# Patient Record
Sex: Female | Born: 1960 | Race: White | Hispanic: No | Marital: Single | Smoking: Never smoker
Health system: Southern US, Community
[De-identification: ages and names within clinical notes are randomized; demographics above are authoritative.]

## PROBLEM LIST (undated history)

## (undated) DIAGNOSIS — F32A Depression, unspecified: Secondary | ICD-10-CM

## (undated) DIAGNOSIS — N200 Calculus of kidney: Secondary | ICD-10-CM

## (undated) DIAGNOSIS — N390 Urinary tract infection, site not specified: Secondary | ICD-10-CM

## (undated) DIAGNOSIS — T8859XA Other complications of anesthesia, initial encounter: Secondary | ICD-10-CM

## (undated) DIAGNOSIS — L0390 Cellulitis, unspecified: Secondary | ICD-10-CM

## (undated) DIAGNOSIS — J189 Pneumonia, unspecified organism: Secondary | ICD-10-CM

## (undated) DIAGNOSIS — M1990 Unspecified osteoarthritis, unspecified site: Secondary | ICD-10-CM

## (undated) DIAGNOSIS — R51 Headache: Secondary | ICD-10-CM

## (undated) DIAGNOSIS — R768 Other specified abnormal immunological findings in serum: Secondary | ICD-10-CM

## (undated) DIAGNOSIS — L409 Psoriasis, unspecified: Secondary | ICD-10-CM

## (undated) DIAGNOSIS — T4145XA Adverse effect of unspecified anesthetic, initial encounter: Secondary | ICD-10-CM

## (undated) DIAGNOSIS — R252 Cramp and spasm: Secondary | ICD-10-CM

## (undated) DIAGNOSIS — R5383 Other fatigue: Secondary | ICD-10-CM

## (undated) DIAGNOSIS — R519 Headache, unspecified: Secondary | ICD-10-CM

## (undated) DIAGNOSIS — G4730 Sleep apnea, unspecified: Secondary | ICD-10-CM

## (undated) DIAGNOSIS — B9620 Unspecified Escherichia coli [E. coli] as the cause of diseases classified elsewhere: Secondary | ICD-10-CM

## (undated) DIAGNOSIS — F329 Major depressive disorder, single episode, unspecified: Secondary | ICD-10-CM

## (undated) HISTORY — PX: LIVER BIOPSY: SHX301

## (undated) HISTORY — PX: WISDOM TOOTH EXTRACTION: SHX21

---

## 1998-10-12 HISTORY — PX: DILATION AND CURETTAGE OF UTERUS: SHX78

## 2018-03-15 ENCOUNTER — Ambulatory Visit (HOSPITAL_COMMUNITY)
Admission: EM | Admit: 2018-03-15 | Discharge: 2018-03-15 | Disposition: A | Discharge status: Home / Self Care | Payer: BLUE CROSS/BLUE SHIELD | Attending: Emergency Medicine | Admitting: Emergency Medicine

## 2018-03-15 ENCOUNTER — Other Ambulatory Visit: Payer: Self-pay

## 2018-03-15 ENCOUNTER — Encounter (HOSPITAL_COMMUNITY): Payer: Self-pay | Admitting: Emergency Medicine

## 2018-03-15 DIAGNOSIS — L409 Psoriasis, unspecified: Secondary | ICD-10-CM | POA: Diagnosis not present

## 2018-03-15 DIAGNOSIS — N23 Unspecified renal colic: Secondary | ICD-10-CM

## 2018-03-15 DIAGNOSIS — R109 Unspecified abdominal pain: Secondary | ICD-10-CM

## 2018-03-15 DIAGNOSIS — R112 Nausea with vomiting, unspecified: Secondary | ICD-10-CM | POA: Diagnosis not present

## 2018-03-15 HISTORY — DX: Psoriasis, unspecified: L40.9

## 2018-03-15 LAB — POCT URINALYSIS DIP (DEVICE)
Bilirubin Urine: NEGATIVE
Glucose, UA: NEGATIVE mg/dL
Ketones, ur: NEGATIVE mg/dL
Nitrite: POSITIVE — AB
PROTEIN: NEGATIVE mg/dL
SPECIFIC GRAVITY, URINE: 1.02 (ref 1.005–1.030)
UROBILINOGEN UA: 0.2 mg/dL (ref 0.0–1.0)
pH: 8.5 — ABNORMAL HIGH (ref 5.0–8.0)

## 2018-03-15 MED ORDER — TRAMADOL HCL 50 MG PO TABS: 50.0000 mg | ORAL_TABLET | Freq: Four times a day (QID) | ORAL | 0 refills | Status: AC | PRN

## 2018-03-15 MED ORDER — KETOROLAC TROMETHAMINE 60 MG/2ML IM SOLN
60.0000 mg | Freq: Once | INTRAMUSCULAR | Status: AC
  Administered 2018-03-15: 60 mg via INTRAMUSCULAR

## 2018-03-15 MED ORDER — CEPHALEXIN 500 MG PO CAPS: 500.0000 mg | ORAL_CAPSULE | Freq: Three times a day (TID) | ORAL | 0 refills | Status: AC

## 2018-03-15 MED ORDER — NAPROXEN 500 MG PO TABS: 500.0000 mg | ORAL_TABLET | Freq: Two times a day (BID) | ORAL | 0 refills | Status: DC

## 2018-03-15 MED ORDER — KETOROLAC TROMETHAMINE 60 MG/2ML IM SOLN
INTRAMUSCULAR | Status: AC
  Filled 2018-03-15: qty 2

## 2018-03-15 MED ORDER — ONDANSETRON HCL 4 MG PO TABS: 4.0000 mg | ORAL_TABLET | Freq: Four times a day (QID) | ORAL | 0 refills | Status: DC

## 2018-03-15 NOTE — ED Provider Notes (Signed)
03/15/2018 8:05 PM   DOB: 03/04/1961 / MRN: [id number]  SUBJECTIVE:  Rebecca Maldonado is a 57 y.o. female presenting for left-sided flank pain, nausea, emesis.  Symptoms started today.  Patient is moving her bowels normally denies dysuria, urgency, frequency,, hematuria.  Pain comes in waves.  She is allergic to wellbutrin [bupropion].   She  has a past medical history of Psoriasis.    She  reports that she has never smoked. She has never used smokeless tobacco. She reports that she drinks alcohol. She reports that she does not use drugs. She  has no sexual activity history on file. The patient  has a past surgical history that includes Cesarean section.  Her family history is not on file.  Review of Systems  Constitutional: Negative for chills, diaphoresis and fever.  Eyes: Negative.   Respiratory: Negative for cough, hemoptysis, sputum production, shortness of breath and wheezing.   Cardiovascular: Negative for chest pain.  Gastrointestinal: Positive for nausea and vomiting. Negative for abdominal pain, blood in stool, constipation, diarrhea, heartburn and melena.  Genitourinary: Positive for flank pain.  Skin: Negative for rash.  Neurological: Negative for dizziness, sensory change, speech change, focal weakness and headaches.    OBJECTIVE:  BP 115/78 (BP Location: Left Arm)   Pulse 81   Temp 98.2 F (36.8 C) (Oral)   Resp 20   SpO2 95%   Wt Readings from Last 3 Encounters:  No data found for Wt   Temp Readings from Last 3 Encounters:  03/15/18 98.2 F (36.8 C) (Oral)   BP Readings from Last 3 Encounters:  03/15/18 115/78   Pulse Readings from Last 3 Encounters:  03/15/18 81    Physical Exam  Constitutional: She is oriented to person, place, and time. She appears well-nourished. No distress.  Eyes: Pupils are equal, round, and reactive to light. EOM are normal.  Cardiovascular: Normal rate, regular rhythm, S1 normal, S2 normal, normal heart sounds and intact distal  pulses. Exam reveals no gallop, no friction rub and no decreased pulses.  No murmur heard. Pulmonary/Chest: Effort normal. No stridor. No respiratory distress. She has no wheezes. She has no rales.  Abdominal: She exhibits no distension. There is tenderness. There is CVA tenderness (left). There is no rigidity, no rebound, no guarding, no tenderness at McBurney's point and negative Murphy's sign.  Musculoskeletal: She exhibits no edema.  Neurological: She is alert and oriented to person, place, and time. No cranial nerve deficit. Gait normal.  Skin: Skin is dry. She is not diaphoretic.  Psychiatric: She has a normal mood and affect.  Vitals reviewed.   Results for orders placed or performed during the hospital encounter of 03/15/18 (from the past 72 hour(s))  POCT urinalysis dip (device)     Status: Abnormal   Collection Time: 03/15/18  7:58 PM  Result Value Ref Range   Glucose, UA NEGATIVE NEGATIVE mg/dL   Bilirubin Urine NEGATIVE NEGATIVE   Ketones, ur NEGATIVE NEGATIVE mg/dL   Specific Gravity, Urine 1.020 1.005 - 1.030   Hgb urine dipstick TRACE (A) NEGATIVE   pH 8.5 (H) 5.0 - 8.0   Protein, ur NEGATIVE NEGATIVE mg/dL   Urobilinogen, UA 0.2 0.0 - 1.0 mg/dL   Nitrite POSITIVE (A) NEGATIVE   Leukocytes, UA TRACE (A) NEGATIVE    Comment: Biochemical Testing Only. Please order routine urinalysis from main lab if confirmatory testing is needed.    No results found.  ASSESSMENT AND PLAN:   Left flank pain: 57 year old female here today  with chief complaint of left flank pain with radicular pattern to the left groin.  Nitrites + but no signs of toxicity. She is afebrile here and appears to be in no acute distress.  She has some flank tenderness on the left. Will treat as a stone along with abx.  She will call urology if the symptoms continue given this is most likely a stone.  Renal colic on left side    Discharge Instructions     Take the medication as prescribed.  Okay to add  in tramadol for break through pain. I am referring you to urology however if the pain gets to bad you may have to go to the ED for fluids and pain meds.         The patient is advised to call or return to clinic if she does not see an improvement in symptoms, or to seek the care of the closest emergency department if she worsens with the above plan.   Deliah BostonMichael Meredith Kilbride, MHS, PA-C 03/15/2018 8:05 PM   Rebecca Maldonado, Rebecca Verrastro L, PA-C 03/15/18 2007

## 2018-03-15 NOTE — ED Triage Notes (Signed)
[facility] a complaint of left side pain that started in her back and radiates into her groin area. The patient reported that the pain started today at work.

## 2018-03-15 NOTE — Discharge Instructions (Addendum)
Urine might be infected. I will culture. Take the medication as prescribed.  Okay to add in tramadol for break through pain. I am referring you to urology however if the pain gets to bad you may have to go to the ED for fluids and pain meds.

## 2018-03-18 ENCOUNTER — Telehealth (HOSPITAL_COMMUNITY): Payer: Self-pay

## 2018-03-18 DIAGNOSIS — B9620 Unspecified Escherichia coli [E. coli] as the cause of diseases classified elsewhere: Secondary | ICD-10-CM

## 2018-03-18 HISTORY — DX: Unspecified Escherichia coli (E. coli) as the cause of diseases classified elsewhere: B96.20

## 2018-03-18 LAB — URINE CULTURE: Culture: >=100000 — AB

## 2018-03-18 NOTE — Telephone Encounter (Signed)
Urine culture positive for E.Coli, this was treated with Keflex at urgent care visit. Attempted to reach patient to go over results. No answer at this time.

## 2018-08-30 ENCOUNTER — Other Ambulatory Visit: Payer: Self-pay | Admitting: Urology

## 2018-08-31 ENCOUNTER — Other Ambulatory Visit (HOSPITAL_COMMUNITY): Payer: Self-pay | Admitting: Urology

## 2018-08-31 DIAGNOSIS — N200 Calculus of kidney: Secondary | ICD-10-CM

## 2018-09-05 ENCOUNTER — Encounter (HOSPITAL_COMMUNITY): Payer: Self-pay

## 2018-09-05 NOTE — Patient Instructions (Signed)
Your procedure is scheduled on: Monday, Dec. 2, 2019   Surgery Time:  11:00AM-1:00PM   Report to [facility] Main  Entrance    Report to admitting at 8:00 AM   Call this number if you have problems the morning of surgery [phone]   Do not eat food or drink liquids :After Midnight.   Brush your teeth the morning of surgery.   Do NOT smoke after Midnight   Take these medicines the morning of surgery with A SIP OF WATER: None                               You may not have any metal on your body including hair pins, jewelry, and body piercings             Do not wear make-up, lotions, powders, perfumes/cologne, or deodorant             Do not wear nail polish.  Do not shave  48 hours prior to surgery.                Do not bring valuables to the hospital. [facility] IS NOT             RESPONSIBLE   FOR VALUABLES.   Contacts, dentures or bridgework may not be worn into surgery.   Leave suitcase in the car. After surgery it may be brought to your room.    Special Instructions: Bring a copy of your healthcare power of attorney and living will documents         the day of surgery if you haven't scanned them in before.              Please read over the following fact sheets you were given:  [facility] Pre[facility] Before surgery, you can play an important role.  Because skin is not sterile, your skin needs to be as free of germs as possible.  You can reduce the number of germs on your skin by washing with CHG (chlorahexidine gluconate) soap before surgery.  CHG is an antiseptic cleaner which kills germs and bonds with the skin to continue killing germs even after washing. Please DO NOT use if you have an allergy to CHG or antibacterial soaps.  If your skin becomes reddened/irritated stop using the CHG and inform your nurse when you arrive at Short Stay. Do not shave (including legs and underarms) for at least 48 hours prior to the first CHG shower.  You may  shave your face/neck.  Please follow these instructions carefully:  1.  Shower with CHG Soap the night before surgery and the  morning of surgery.  2.  If you choose to wash your hair, wash your hair first as usual with your normal  shampoo.  3.  After you shampoo, rinse your hair and body thoroughly to remove the shampoo.                             4.  Use CHG as you would any other liquid soap.  You can apply chg directly to the skin and wash.  Gently with a scrungie or clean washcloth.  5.  Apply the CHG Soap to your body ONLY FROM THE NECK DOWN.   Do   not use on face/ open  Wound or open sores. Avoid contact with eyes, ears mouth and   genitals (private parts).                       Wash face,  Genitals (private parts) with your normal soap.             6.  Wash thoroughly, paying special attention to the area where your    surgery  will be performed.  7.  Thoroughly rinse your body with warm water from the neck down.  8.  DO NOT shower/wash with your normal soap after using and rinsing off the CHG Soap.                9.  Pat yourself dry with a clean towel.            10.  Wear clean pajamas.            11.  Place clean sheets on your bed the night of your first shower and do not  sleep with pets. Day of Surgery : Do not apply any lotions/deodorants the morning of surgery.  Please wear clean clothes to the hospital/surgery center.  FAILURE TO FOLLOW THESE INSTRUCTIONS MAY RESULT IN THE CANCELLATION OF YOUR SURGERY  PATIENT SIGNATURE_________________________________  NURSE SIGNATURE__________________________________  ________________________________________________________________________

## 2018-09-06 ENCOUNTER — Other Ambulatory Visit: Payer: Self-pay

## 2018-09-06 ENCOUNTER — Encounter (HOSPITAL_COMMUNITY): Payer: Self-pay

## 2018-09-06 ENCOUNTER — Encounter (HOSPITAL_COMMUNITY)
Admission: RE | Admit: 2018-09-06 | Discharge: 2018-09-06 | Disposition: A | Discharge status: Home / Self Care | Payer: BLUE CROSS/BLUE SHIELD | Source: Ambulatory Visit | Attending: Urology | Admitting: Urology

## 2018-09-06 DIAGNOSIS — N200 Calculus of kidney: Secondary | ICD-10-CM | POA: Diagnosis not present

## 2018-09-06 DIAGNOSIS — Z01812 Encounter for preprocedural laboratory examination: Secondary | ICD-10-CM | POA: Diagnosis not present

## 2018-09-06 HISTORY — DX: Major depressive disorder, single episode, unspecified: F32.9

## 2018-09-06 HISTORY — DX: Headache, unspecified: R51.9

## 2018-09-06 HISTORY — DX: Adverse effect of unspecified anesthetic, initial encounter: T41.45XA

## 2018-09-06 HISTORY — DX: Other fatigue: R53.83

## 2018-09-06 HISTORY — DX: Other complications of anesthesia, initial encounter: T88.59XA

## 2018-09-06 HISTORY — DX: Unspecified osteoarthritis, unspecified site: M19.90

## 2018-09-06 HISTORY — DX: Calculus of kidney: N20.0

## 2018-09-06 HISTORY — DX: Urinary tract infection, site not specified: N39.0

## 2018-09-06 HISTORY — DX: Sleep apnea, unspecified: G47.30

## 2018-09-06 HISTORY — DX: Pneumonia, unspecified organism: J18.9

## 2018-09-06 HISTORY — DX: Depression, unspecified: F32.A

## 2018-09-06 HISTORY — DX: Other specified abnormal immunological findings in serum: R76.8

## 2018-09-06 HISTORY — DX: Headache: R51

## 2018-09-06 HISTORY — DX: Morbid (severe) obesity due to excess calories: E66.01

## 2018-09-06 HISTORY — DX: Unspecified Escherichia coli (E. coli) as the cause of diseases classified elsewhere: B96.20

## 2018-09-06 LAB — CBC
HCT: 41.2 % (ref 36.0–46.0)
HEMOGLOBIN: 13.2 g/dL (ref 12.0–15.0)
MCH: 29.1 pg (ref 26.0–34.0)
MCHC: 32 g/dL (ref 30.0–36.0)
MCV: 90.9 fL (ref 80.0–100.0)
NRBC: 0 % (ref 0.0–0.2)
PLATELETS: 311 10*3/uL (ref 150–400)
RBC: 4.53 MIL/uL (ref 3.87–5.11)
RDW: 13.2 % (ref 11.5–15.5)
WBC: 5.6 10*3/uL (ref 4.0–10.5)

## 2018-09-09 ENCOUNTER — Other Ambulatory Visit: Payer: Self-pay | Admitting: Radiology

## 2018-09-11 MED ORDER — DEXTROSE 5 % IV SOLN
3.0000 g | INTRAVENOUS | Status: AC
  Administered 2018-09-12: 3 g via INTRAVENOUS
  Filled 2018-09-11: qty 3

## 2018-09-11 NOTE — Discharge Instructions (Signed)

## 2018-09-12 ENCOUNTER — Other Ambulatory Visit: Payer: Self-pay

## 2018-09-12 ENCOUNTER — Ambulatory Visit (HOSPITAL_COMMUNITY)
Admission: RE | Admit: 2018-09-12 | Discharge: 2018-09-13 | Disposition: A | Discharge status: Home / Self Care | Payer: BLUE CROSS/BLUE SHIELD | Source: Ambulatory Visit | Attending: Urology | Admitting: Urology

## 2018-09-12 ENCOUNTER — Ambulatory Visit (HOSPITAL_COMMUNITY)
Admission: RE | Admit: 2018-09-12 | Discharge: 2018-09-12 | Disposition: A | Discharge status: Home / Self Care | Payer: BLUE CROSS/BLUE SHIELD | Source: Ambulatory Visit | Attending: Urology | Admitting: Urology

## 2018-09-12 ENCOUNTER — Ambulatory Visit (HOSPITAL_COMMUNITY): Payer: BLUE CROSS/BLUE SHIELD

## 2018-09-12 ENCOUNTER — Encounter (HOSPITAL_COMMUNITY): Payer: Self-pay | Admitting: *Deleted

## 2018-09-12 ENCOUNTER — Ambulatory Visit (HOSPITAL_COMMUNITY): Payer: BLUE CROSS/BLUE SHIELD | Admitting: Anesthesiology

## 2018-09-12 ENCOUNTER — Encounter (HOSPITAL_COMMUNITY)
Admission: RE | Disposition: A | Discharge status: Home / Self Care | Payer: Self-pay | Source: Ambulatory Visit | Attending: Urology

## 2018-09-12 DIAGNOSIS — Z8379 Family history of other diseases of the digestive system: Secondary | ICD-10-CM | POA: Insufficient documentation

## 2018-09-12 DIAGNOSIS — N132 Hydronephrosis with renal and ureteral calculous obstruction: Secondary | ICD-10-CM | POA: Diagnosis not present

## 2018-09-12 DIAGNOSIS — Z87442 Personal history of urinary calculi: Secondary | ICD-10-CM | POA: Diagnosis not present

## 2018-09-12 DIAGNOSIS — Z87440 Personal history of urinary (tract) infections: Secondary | ICD-10-CM | POA: Insufficient documentation

## 2018-09-12 DIAGNOSIS — M1990 Unspecified osteoarthritis, unspecified site: Secondary | ICD-10-CM | POA: Insufficient documentation

## 2018-09-12 DIAGNOSIS — N3020 Other chronic cystitis without hematuria: Secondary | ICD-10-CM | POA: Diagnosis not present

## 2018-09-12 DIAGNOSIS — N200 Calculus of kidney: Secondary | ICD-10-CM

## 2018-09-12 DIAGNOSIS — Z6841 Body Mass Index (BMI) 40.0 and over, adult: Secondary | ICD-10-CM | POA: Insufficient documentation

## 2018-09-12 DIAGNOSIS — Z7982 Long term (current) use of aspirin: Secondary | ICD-10-CM | POA: Diagnosis not present

## 2018-09-12 DIAGNOSIS — Z825 Family history of asthma and other chronic lower respiratory diseases: Secondary | ICD-10-CM | POA: Insufficient documentation

## 2018-09-12 DIAGNOSIS — L409 Psoriasis, unspecified: Secondary | ICD-10-CM | POA: Insufficient documentation

## 2018-09-12 DIAGNOSIS — Z840 Family history of diseases of the skin and subcutaneous tissue: Secondary | ICD-10-CM | POA: Diagnosis not present

## 2018-09-12 DIAGNOSIS — F329 Major depressive disorder, single episode, unspecified: Secondary | ICD-10-CM | POA: Diagnosis not present

## 2018-09-12 DIAGNOSIS — G4730 Sleep apnea, unspecified: Secondary | ICD-10-CM | POA: Insufficient documentation

## 2018-09-12 HISTORY — PX: IR URETERAL STENT LEFT NEW ACCESS W/O SEP NEPHROSTOMY CATH: IMG6075

## 2018-09-12 HISTORY — PX: NEPHROLITHOTOMY: SHX5134

## 2018-09-12 LAB — CBC WITH DIFFERENTIAL/PLATELET
Abs Immature Granulocytes: 0.02 10*3/uL (ref 0.00–0.07)
Basophils Absolute: 0.1 10*3/uL (ref 0.0–0.1)
Basophils Relative: 1 %
Eosinophils Absolute: 0.2 10*3/uL (ref 0.0–0.5)
Eosinophils Relative: 5 %
HCT: 39.2 % (ref 36.0–46.0)
Hemoglobin: 12.7 g/dL (ref 12.0–15.0)
Immature Granulocytes: 0 %
Lymphocytes Relative: 33 %
Lymphs Abs: 1.6 10*3/uL (ref 0.7–4.0)
MCH: 30.3 pg (ref 26.0–34.0)
MCHC: 32.4 g/dL (ref 30.0–36.0)
MCV: 93.6 fL (ref 80.0–100.0)
MONOS PCT: 12 %
Monocytes Absolute: 0.6 10*3/uL (ref 0.1–1.0)
Neutro Abs: 2.4 10*3/uL (ref 1.7–7.7)
Neutrophils Relative %: 49 %
Platelets: 267 10*3/uL (ref 150–400)
RBC: 4.19 MIL/uL (ref 3.87–5.11)
RDW: 13.5 % (ref 11.5–15.5)
WBC: 4.9 10*3/uL (ref 4.0–10.5)
nRBC: 0 % (ref 0.0–0.2)

## 2018-09-12 LAB — BASIC METABOLIC PANEL
Anion gap: 10 (ref 5–15)
BUN: 18 mg/dL (ref 6–20)
CO2: 26 mmol/L (ref 22–32)
CREATININE: 0.84 mg/dL (ref 0.44–1.00)
Calcium: 9.1 mg/dL (ref 8.9–10.3)
Chloride: 104 mmol/L (ref 98–111)
GFR calc Af Amer: >60 mL/min (ref >60)
GFR calc non Af Amer: >60 mL/min (ref >60)
Glucose, Bld: 110 mg/dL — ABNORMAL HIGH (ref 70–99)
Potassium: 4.2 mmol/L (ref 3.5–5.1)
Sodium: 140 mmol/L (ref 135–145)

## 2018-09-12 LAB — PROTIME-INR
INR: 0.88
Prothrombin Time: 11.8 seconds (ref 11.4–15.2)

## 2018-09-12 SURGERY — NEPHROLITHOTOMY PERCUTANEOUS
Anesthesia: General | Site: Abdomen | Laterality: Left

## 2018-09-12 MED ORDER — PROPOFOL 10 MG/ML IV BOLUS
INTRAVENOUS | Status: AC
  Filled 2018-09-12: qty 20

## 2018-09-12 MED ORDER — FENTANYL CITRATE (PF) 100 MCG/2ML IJ SOLN
INTRAMUSCULAR | Status: AC | PRN
  Administered 2018-09-12 (×3): 50 ug via INTRAVENOUS

## 2018-09-12 MED ORDER — LIDOCAINE HCL 1 % IJ SOLN
INTRAMUSCULAR | Status: AC
  Filled 2018-09-12: qty 20

## 2018-09-12 MED ORDER — ACETAMINOPHEN 325 MG PO TABS: 650.0000 mg | ORAL_TABLET | ORAL | Status: DC | PRN

## 2018-09-12 MED ORDER — ROCURONIUM BROMIDE 10 MG/ML (PF) SYRINGE
PREFILLED_SYRINGE | INTRAVENOUS | Status: DC | PRN
  Administered 2018-09-12: 50 mg via INTRAVENOUS

## 2018-09-12 MED ORDER — CEFAZOLIN SODIUM-DEXTROSE 2-4 GM/100ML-% IV SOLN: 2.0000 g | INTRAVENOUS | Status: DC

## 2018-09-12 MED ORDER — OXYCODONE-ACETAMINOPHEN 5-325 MG PO TABS: 1.0000 | ORAL_TABLET | ORAL | Status: DC | PRN

## 2018-09-12 MED ORDER — HYDROMORPHONE HCL 1 MG/ML IJ SOLN
0.5000 mg | INTRAMUSCULAR | Status: DC | PRN
  Administered 2018-09-12 (×2): 1 mg via INTRAVENOUS
  Filled 2018-09-12 (×2): qty 1

## 2018-09-12 MED ORDER — LACTATED RINGERS IV SOLN
INTRAVENOUS | Status: DC
  Administered 2018-09-12: 08:00:00 via INTRAVENOUS

## 2018-09-12 MED ORDER — FENTANYL CITRATE (PF) 250 MCG/5ML IJ SOLN
INTRAMUSCULAR | Status: AC
  Filled 2018-09-12: qty 5

## 2018-09-12 MED ORDER — IOPAMIDOL (ISOVUE-300) INJECTION 61%
INTRAVENOUS | Status: AC
  Filled 2018-09-12: qty 50

## 2018-09-12 MED ORDER — BELLADONNA ALKALOIDS-OPIUM 16.2-60 MG RE SUPP: 1.0000 | Freq: Four times a day (QID) | RECTAL | Status: DC | PRN

## 2018-09-12 MED ORDER — HYDROCODONE-ACETAMINOPHEN 10-325 MG PO TABS: 1.0000 | ORAL_TABLET | ORAL | 0 refills | Status: AC | PRN

## 2018-09-12 MED ORDER — FENTANYL CITRATE (PF) 100 MCG/2ML IJ SOLN
INTRAMUSCULAR | Status: AC
  Filled 2018-09-12: qty 2

## 2018-09-12 MED ORDER — SODIUM CHLORIDE 0.9 % IV SOLN
INTRAVENOUS | Status: DC
  Administered 2018-09-12 – 2018-09-13 (×2): via INTRAVENOUS

## 2018-09-12 MED ORDER — MIDAZOLAM HCL 2 MG/2ML IJ SOLN
INTRAMUSCULAR | Status: AC
  Filled 2018-09-12: qty 2

## 2018-09-12 MED ORDER — IOHEXOL 300 MG/ML  SOLN
INTRAMUSCULAR | Status: DC | PRN
  Administered 2018-09-12: 30 mL

## 2018-09-12 MED ORDER — ONDANSETRON HCL 4 MG/2ML IJ SOLN
INTRAMUSCULAR | Status: DC | PRN
  Administered 2018-09-12: 4 mg via INTRAVENOUS

## 2018-09-12 MED ORDER — ONDANSETRON HCL 4 MG/2ML IJ SOLN: 4.0000 mg | INTRAMUSCULAR | Status: DC | PRN

## 2018-09-12 MED ORDER — SODIUM CHLORIDE 0.9 % IR SOLN
Status: DC | PRN
  Administered 2018-09-12: 1000 mL

## 2018-09-12 MED ORDER — DEXAMETHASONE SODIUM PHOSPHATE 10 MG/ML IJ SOLN
INTRAMUSCULAR | Status: DC | PRN
  Administered 2018-09-12: 10 mg via INTRAVENOUS

## 2018-09-12 MED ORDER — IOPAMIDOL (ISOVUE-300) INJECTION 61%
50.0000 mL | Freq: Once | INTRAVENOUS | Status: AC | PRN
  Administered 2018-09-12: 10 mL

## 2018-09-12 MED ORDER — SODIUM CHLORIDE 0.9 % IR SOLN
Status: DC | PRN
  Administered 2018-09-12: 15000 mL

## 2018-09-12 MED ORDER — LIDOCAINE 2% (20 MG/ML) 5 ML SYRINGE
INTRAMUSCULAR | Status: DC | PRN
  Administered 2018-09-12: 50 mg via INTRAVENOUS

## 2018-09-12 MED ORDER — FENTANYL CITRATE (PF) 100 MCG/2ML IJ SOLN
INTRAMUSCULAR | Status: DC | PRN
  Administered 2018-09-12 (×2): 50 ug via INTRAVENOUS
  Administered 2018-09-12: 100 ug via INTRAVENOUS

## 2018-09-12 MED ORDER — DIPHENHYDRAMINE HCL 50 MG/ML IJ SOLN
INTRAMUSCULAR | Status: DC | PRN
  Administered 2018-09-12: 12.5 mg via INTRAVENOUS

## 2018-09-12 MED ORDER — MIDAZOLAM HCL 2 MG/2ML IJ SOLN
INTRAMUSCULAR | Status: AC | PRN
  Administered 2018-09-12 (×4): 1 mg via INTRAVENOUS

## 2018-09-12 MED ORDER — SUGAMMADEX SODIUM 500 MG/5ML IV SOLN
INTRAVENOUS | Status: DC | PRN
  Administered 2018-09-12: 300 mg via INTRAVENOUS

## 2018-09-12 MED ORDER — SODIUM CHLORIDE 0.9 % IV SOLN: INTRAVENOUS | Status: DC

## 2018-09-12 MED ORDER — EPHEDRINE SULFATE-NACL 50-0.9 MG/10ML-% IV SOSY
PREFILLED_SYRINGE | INTRAVENOUS | Status: DC | PRN
  Administered 2018-09-12: 5 mg via INTRAVENOUS
  Administered 2018-09-12: 10 mg via INTRAVENOUS

## 2018-09-12 MED ORDER — CEFAZOLIN SODIUM-DEXTROSE 1-4 GM/50ML-% IV SOLN
1.0000 g | Freq: Three times a day (TID) | INTRAVENOUS | Status: AC
  Administered 2018-09-12 – 2018-09-13 (×2): 1 g via INTRAVENOUS
  Filled 2018-09-12 (×3): qty 50

## 2018-09-12 MED ORDER — PROPOFOL 10 MG/ML IV BOLUS
INTRAVENOUS | Status: DC | PRN
  Administered 2018-09-12: 200 mg via INTRAVENOUS

## 2018-09-12 MED ORDER — IOPAMIDOL (ISOVUE-300) INJECTION 61%
INTRAVENOUS | Status: AC
  Administered 2018-09-12: 10 mL
  Filled 2018-09-12: qty 50

## 2018-09-12 SURGICAL SUPPLY — 47 items
APPLICATOR SURGIFLO ENDO (HEMOSTASIS) ×2 IMPLANT
BAG URINE DRAINAGE (UROLOGICAL SUPPLIES) IMPLANT
BASKET ZERO TIP NITINOL 2.4FR (BASKET) IMPLANT
BENZOIN TINCTURE PRP APPL 2/3 (GAUZE/BANDAGES/DRESSINGS) ×2 IMPLANT
BLADE SURG 15 STRL LF DISP TIS (BLADE) ×1 IMPLANT
BLADE SURG 15 STRL SS (BLADE) ×1
CATH FOLEY 2W COUNCIL 20FR 5CC (CATHETERS) IMPLANT
CATH FOLEY 2WAY SLVR  5CC 18FR (CATHETERS) ×1
CATH FOLEY 2WAY SLVR 5CC 18FR (CATHETERS) ×1 IMPLANT
CATH IMAGER II 65CM (CATHETERS) ×2 IMPLANT
CATH X-FORCE N30 NEPHROSTOMY (TUBING) ×2 IMPLANT
CHLORAPREP W/TINT 26ML (MISCELLANEOUS) ×2 IMPLANT
COVER SURGICAL LIGHT HANDLE (MISCELLANEOUS) ×2 IMPLANT
COVER WAND RF STERILE (DRAPES) IMPLANT
DRAPE C-ARM 42X120 X-RAY (DRAPES) ×2 IMPLANT
DRAPE LINGEMAN PERC (DRAPES) ×2 IMPLANT
DRAPE SURG IRRIG POUCH 19X23 (DRAPES) ×2 IMPLANT
DRSG PAD ABDOMINAL 8X10 ST (GAUZE/BANDAGES/DRESSINGS) ×4 IMPLANT
DRSG TEGADERM 4X4.75 (GAUZE/BANDAGES/DRESSINGS) ×2 IMPLANT
DRSG TEGADERM 8X12 (GAUZE/BANDAGES/DRESSINGS) IMPLANT
FIBER LASER TRAC TIP (UROLOGICAL SUPPLIES) IMPLANT
GAUZE SPONGE 4X4 12PLY STRL (GAUZE/BANDAGES/DRESSINGS) ×2 IMPLANT
GLOVE BIOGEL M 8.0 STRL (GLOVE) ×2 IMPLANT
GOWN STRL REUS W/TWL XL LVL3 (GOWN DISPOSABLE) ×2 IMPLANT
GUIDEWIRE AMPLAZ .035X145 (WIRE) ×4 IMPLANT
GUIDEWIRE STR DUAL SENSOR (WIRE) ×2 IMPLANT
HOLDER FOLEY CATH W/STRAP (MISCELLANEOUS) ×2 IMPLANT
KIT BASIN OR (CUSTOM PROCEDURE TRAY) ×2 IMPLANT
KIT PROBE TRILOGY 3.9X350 (MISCELLANEOUS) ×2 IMPLANT
MANIFOLD NEPTUNE II (INSTRUMENTS) ×2 IMPLANT
NS IRRIG 1000ML POUR BTL (IV SOLUTION) ×2 IMPLANT
PACK CYSTO (CUSTOM PROCEDURE TRAY) ×2 IMPLANT
PROBE LITHOCLAST ULTRA 3.8X403 (UROLOGICAL SUPPLIES) IMPLANT
PROBE PNEUMATIC 1.0MMX570MM (UROLOGICAL SUPPLIES) IMPLANT
SHEATH PEELAWAY SET 9 (SHEATH) ×2 IMPLANT
SPONGE LAP 4X18 RFD (DISPOSABLE) ×2 IMPLANT
STENT URET 6FRX24 CONTOUR (STENTS) ×2 IMPLANT
SURGIFLO W/THROMBIN 8M KIT (HEMOSTASIS) ×2 IMPLANT
SUT MNCRL AB 4-0 PS2 18 (SUTURE) IMPLANT
SUT SILK 2 0 30  PSL (SUTURE)
SUT SILK 2 0 30 PSL (SUTURE) IMPLANT
SYR 10ML LL (SYRINGE) ×2 IMPLANT
SYR 20CC LL (SYRINGE) ×4 IMPLANT
TOWEL OR 17X26 10 PK STRL BLUE (TOWEL DISPOSABLE) ×2 IMPLANT
TOWEL OR NON WOVEN STRL DISP B (DISPOSABLE) ×2 IMPLANT
TRAY FOLEY MTR SLVR 16FR STAT (SET/KITS/TRAYS/PACK) ×2 IMPLANT
TUBING CONNECTING 10 (TUBING) ×4 IMPLANT

## 2018-09-12 NOTE — Progress Notes (Signed)
Night of surgery note  Subjective: The patient is doing well.  No complaints.  Objective: Vital signs in last 24 hours: Temp:  [97.8 F (36.6 C)-99.3 F (37.4 C)] 98.1 F (36.7 C) (12/02 1425) Pulse Rate:  [53-78] 61 (12/02 1425) Resp:  [10-19] 16 (12/02 1425) BP: (114-185)/(56-115) 143/66 (12/02 1425) SpO2:  [95 %-100 %] 100 % (12/02 1425) Weight:  [161[127 kg] 127 kg (12/02 1035)  Intake/Output this shift: Total I/O In: 1020 [P.O.:120; I.V.:900] Out: 425 [Urine:400; Blood:25]  Physical Exam:  General: Alert and oriented. Abdomen: Soft, Nondistended. Incision: Clean with dry, intact dressing. GU: Foley catheter draining pink urine with no clots.  Lab Results: Recent Labs    09/12/18 0802  HGB 12.7  HCT 39.2    Assessment/Plan: Discussed procedure and possibility of remaining fragments.  CT scan will be obtained to evaluate for this. 1) Continue to monitor 2) Per orders   Laquida Cotrell C. Vernie Ammonsttelin, MD  [place] FarmTTELIN,Steed Kanaan C 09/12/2018, 4:47 PM

## 2018-09-12 NOTE — H&P (Signed)
HPI: Rebecca Maldonado is a 57 year-old female who presents today for PCNL of her left renal calculi.  She does have a history of urinary infections. She has had 5 UTIs in the past year. Her recurrent UTIs have been occurring for 5 months. 0 infections required hospitalization. The following are the medication(s) that have been tried: cipro.   She has not had a kidney stone. She feels that she does empty her bladder. She has gone through menopause. She has not been on hormone replacement therapy.   08/02/18: The patient is self-referred for "recurrent UTIs". She was experiencing left flank pain that was felt to be colicky in nature and was seen in the ER at that time. She was found have nitrite positive urine and was diagnosed with a possible kidney stone however urine culture was performed and found to be positive for E. coli. There is no other documentation of urinary tract infections in Epic.  She 1st developed a UTI in 5/19 that was associated with pain in her lower back and left lower quadrant as well as nausea and vomiting. She was diagnosed with an infection, treated with antibiotics and noted complete resolution of her symptoms only to have her symptoms recurred again 2 weeks later again with associated nausea and vomiting but she was told she did not have any hematuria to suggest a possible stone and has no prior history of stones. She then had 3rd infection in 8/19 that was associated with mainly dysuria, fatigue and headache. She has had a 4th and now is experiencing symptoms of her 5th infection. Azo Standard helps with the symptoms. She believes this may be associated with dehydration but it is not associated with sexual intercourse. She does feel as if she empties her bladder completely. She has gone through menopause and is not on hormone replacement therapy.   08/18/18: She has returned today for further evaluation of the cause of her recurrent UTIs with renal ultrasound and cystoscopy. She  reports to me that she was doing well on antibiotic therapy but after completing her antibiotics she began to note symptoms to suggest her infection was possibly recurring. She also said that today she has noted some slight discomfort in her left flank region.     ALLERGIES: buPROPion - Hives Wellbutrin    MEDICATIONS: Aleve  Azo  Cystex Plus  Taltz Autoinjector     GU PSH: None   NON-GU PSH: C-Section D&&C    GU PMH: Chronic cystitis (w/o hematuria), She appears to be having recurrent UTIs. I am going to evaluate her upper tract with an ultrasound when she returns and then perform cystoscopy. We will then discuss options for management of her recurrent infections. - 08/02/2018    NON-GU PMH: Pyuria/other UA findings, She currently has bacteriuria and pyuria with nitrite positivity. I will go ahead and start her on empiric antibiotics and culture her urine. - 08/02/2018    FAMILY HISTORY: 1 son - Son 3 daughters - Daughter Asthma - Father COPD (chronic obstructive pulmonary disease) with - Father gallstones - Mother Hepatitis - Father, Mother psoriasis - Father   SOCIAL HISTORY: Marital Status: Divorced Preferred Language: English; Ethnicity: Not Hispanic Or Latino; Race: White Current Smoking Status: Patient has never smoked.   Tobacco Use Assessment Completed: Used Tobacco in last 30 days? Social Drinker.  Drinks 2 caffeinated drinks per day.    REVIEW OF SYSTEMS:    GU Review Female:   Patient denies frequent urination, hard to postpone urination,  burning /pain with urination, get up at night to urinate, leakage of urine, stream starts and stops, trouble starting your stream, have to strain to urinate, and being pregnant.  Gastrointestinal (Upper):   Patient denies nausea, vomiting, and indigestion/ heartburn.  Gastrointestinal (Lower):   Patient denies diarrhea and constipation.  Constitutional:   Patient denies night sweats, fever, fatigue, and weight loss.  Skin:    Patient denies skin rash/ lesion and itching.  Eyes:   Patient denies blurred vision and double vision.  Ears/ Nose/ Throat:   Patient denies sore throat and sinus problems.  Hematologic/Lymphatic:   Patient denies swollen glands and easy bruising.  Cardiovascular:   Patient denies leg swelling and chest pains.  Respiratory:   Patient denies cough and shortness of breath.  Endocrine:   Patient denies excessive thirst.  Musculoskeletal:   Patient denies back pain and joint pain.  Neurological:   Patient denies headaches and dizziness.  Psychologic:   Patient denies depression and anxiety.   VITAL SIGNS:   Weight 281 lb / 127.46 kg  Height 65 in / 165.1 cm  BP 136/66 mmHg  Pulse 68 /min  BMI 46.8 kg/m   GU PHYSICAL EXAMINATION:    External Genitalia: No hirsutism, no rash, no scarring, no cyst, no erythematous lesion, no papular lesion, no blanched lesion, no warty lesion. No edema.  Urethral Meatus: Normal size. Normal position. No discharge.  Urethra: No tenderness, no mass, no scarring. No hypermobility. No leakage.  Bladder: Normal to palpation, no tenderness, no mass, normal size.   MULTI-SYSTEM PHYSICAL EXAMINATION:    Constitutional: Well-nourished. No physical deformities. Normally developed. Good grooming.  Neck: Neck symmetrical, not swollen. Normal tracheal position.  Respiratory: No labored breathing, no use of accessory muscles.   Cardiovascular: Normal temperature, normal extremity pulses, no swelling, no varicosities.  Lymphatic: No enlargement of neck, axillae, groin.  Skin: No paleness, no jaundice, no cyanosis. No lesion, no ulcer, no rash.  Neurologic / Psychiatric: Oriented to time, oriented to place, oriented to person. No depression, no anxiety, no agitation.  Gastrointestinal: Obese abdomen. No mass, no tenderness, no rigidity.   Eyes: Normal conjunctivae. Normal eyelids.  Ears, Nose, Mouth, and Throat: Left ear no scars, no lesions, no masses. Right ear no  scars, no lesions, no masses. Nose no scars, no lesions, no masses. Normal hearing. Normal lips.  Musculoskeletal: Normal gait and station of head and neck.        PAST DATA REVIEWED:  Source Of History:  Patient  Records Review:   Previous Patient Records  Urine Test Review:   Urine Culture  Notes:                     Her urine culture at the time of her last visit was positive for E coli.   PROCEDURES:         Flexible Cystoscopy - done on 08/28/18 Risks, benefits, and some of the potential complications of the procedure were discussed at length with the patient including infection, bleeding, voiding discomfort and others. All questions were answered. Sterile technique and intraurethral analgesia were used.  Meatus:  Normal size. Normal location. Normal condition.  Urethra:  No hypermobility. No leakage.  Ureteral Orifices:  Normal location. Normal size. Normal shape. Effluxed clear urine.  Bladder:  No trabeculation. No tumors. Normal mucosa. No stones.      The lower urinary tract was carefully examined. The procedure was well-tolerated and without complications. Instructions were given to  call the office immediately for bloody urine, difficulty urinating, urinary retention, painful or frequent urination, fever or other illness. The patient stated that she understood these instructions and would comply with them.          Renal Ultrasound - 78295  Right kidney  Length: 13.5 cm Depth: 4.2 cm Cortical Width: 1.4 cm Width: 5.8 cm  Left Kidney Length: 13.1 cm Depth: 6.0 cm Cortical Width: 1.4 cm Width: 6.0 cm  Left Kidney/Ureter:  Multiple echogenic foci with and without shadowing ( calcifications vs vessels). The largest measures 2.1 cm in the area of the renal pelvis. ----- Hydronephrosis noted.   Right Kidney/Ureter:  Appears WNL.   Bladder:  PVR = not visualized.       This exam is limited due to bowel gas and body habitus.  Ultrasound images were independently reviewed. The  right kidney looked normal. She has left hydronephrosis. She did have some densities within the left kidney that could possibly be stones.         Urinalysis w/Scope Dipstick Dipstick Cont'd Micro  Color: Yellow Bilirubin: Neg mg/dL WBC/hpf: 40 - 62/ZHY  Appearance: Turbid Ketones: Neg mg/dL RBC/hpf: 3 - 86/VHQ  Specific Gravity: 1.020 Blood: 3+ ery/uL Bacteria: Rare (0-9/hpf)  pH: 6.0 Protein: 3+ mg/dL Cystals: NS (Not Seen)  Glucose: Neg mg/dL Urobilinogen: 0.2 mg/dL Casts: NS (Not Seen)    Nitrites: Neg Trichomonas: Not Present    Leukocyte Esterase: 3+ leu/uL Mucous: Not Present      Epithelial Cells: NS (Not Seen)      Yeast: NS (Not Seen)      Sperm: Not Present    Notes: CLUMPS NOTED    ASSESSMENT/PLAN:      ICD-10 Details  1 GU:   Chronic cystitis (w/o hematuria) - N30.20 Stable - She did she did have some white cells and a few bacteria in her urine today. I will reculture her urine and have empirically placed her on antibiotics due to the finding of hydronephrosis.  2   Hydronephrosis - N13.0 Left, She does have mild left Hydro. She may have some renal calculi in her kidney as well. This needs to be evaluated further and therefore I obtained a CT scan which revealed a large stone in the renal pelvis on the left hand side with a second stone in the lower pole of that same kidney. We discussed the management of urinary stones. These options include observation, ureteroscopy, shockwave lithotripsy, and PCNL. We discussed which options are relevant to these particular stones. We discussed the natural history of stones as well as the complications of untreated stones and the impact on quality of life without treatment as well as with each of the above listed treatments. We also discussed the efficacy of each treatment in its ability to clear the stone burden. With any of these management options I discussed the signs and symptoms of infection and the need for emergent treatment should  these be experienced. For each option we discussed the ability of each procedure to clear the patient of their stone burden.  For observation I described the risks which include but are not limited to silent renal damage, life-threatening infection, need for emergent surgery, failure to pass stone, and pain.  For ureteroscopy I described the risks which include heart attack, stroke, pulmonary embolus, death, bleeding, infection, damage to contiguous structures, positioning injury, ureteral stricture, ureteral avulsion, ureteral injury, need for ureteral stent, inability to perform ureteroscopy, need for an interval procedure, inability to  clear stone burden, stent discomfort and pain.  For shockwave lithotripsy I described the risks which include arrhythmia, kidney contusion, kidney hemorrhage, need for transfusion, long-term risk of diabetes or hypertension, back discomfort, flank ecchymosis, flank abrasion, inability to break up stone, inability to pass stone fragments, Steinstrasse, infection associated with obstructing stones, need for different surgical procedure, need for repeat shockwave lithotripsy, and death.  For PCNL I described the risks including heart attack, sure, pulmonary embolus, death, positioning injury, pneumothorax, hydrothorax, need for chest tube, inability to clear stone burden, renal laceration, arterial venous fistula or malformation, need for embolization of kidney, loss of kidney or renal function, need for repeat procedure, need for prolonged nephrostomy tube, ureteral avulsion, fistula.  We discussed the fact that her stone burden in her left kidney is such that the best means of managing her stones will be with PCNL.  I have described this to her in detail including how the procedure is performed as well as the need for a postoperative stent and possibly postoperative nephrostomy tube, possible need for second look, the need for an overnight stay in the hospital as well as  the anticipated postoperative course and probability of success.  3 NON-GU:   Pyuria/other UA findings - R82.79 Worsening - She has pyuria and said she's having symptoms again so I empirically started her on antibiotics.  Her culture proved positive for E. coli which was sensitive to Bactrim.  She was placed on this and has remained on the medication up until the time of her surgery.

## 2018-09-12 NOTE — Anesthesia Procedure Notes (Signed)
Procedure Name: Intubation Date/Time: 09/12/2018 11:40 AM Performed by: Anne Fu, CRNA Pre-anesthesia Checklist: Patient identified, Emergency Drugs available, Suction available, Patient being monitored and Timeout performed Patient Re-evaluated:Patient Re-evaluated prior to induction Oxygen Delivery Method: Circle system utilized Preoxygenation: Pre-oxygenation with 100% oxygen Induction Type: IV induction Ventilation: Mask ventilation without difficulty Laryngoscope Size: Mac and 4 Grade View: Grade I Tube type: Oral Tube size: 7.5 mm Number of attempts: 1 Airway Equipment and Method: Stylet Placement Confirmation: ETT inserted through vocal cords under direct vision,  positive ETCO2 and breath sounds checked- equal and bilateral Secured at: 21 cm Tube secured with: Tape Dental Injury: Teeth and Oropharynx as per pre-operative assessment

## 2018-09-12 NOTE — Plan of Care (Signed)

## 2018-09-12 NOTE — Discharge Instructions (Signed)
Moderate Conscious Sedation, Adult, Care After  These instructions provide you with information about caring for yourself after your procedure. Your health care provider may also give you more specific instructions. Your treatment has been planned according to current medical practices, but problems sometimes occur. Call your health care provider if you have any problems or questions after your procedure.  What can I expect after the procedure?  After your procedure, it is common:   To feel sleepy for several hours.   To feel clumsy and have poor balance for several hours.   To have poor judgment for several hours.   To vomit if you eat too soon.    Follow these instructions at home:  For at least 24 hours after the procedure:     Do not:  ? Participate in activities where you could fall or become injured.  ? Drive.  ? Use heavy machinery.  ? Drink alcohol.  ? Take sleeping pills or medicines that cause drowsiness.  ? Make important decisions or sign legal documents.  ? Take care of children on your own.   Rest.  Eating and drinking   Follow the diet recommended by your health care provider.   If you vomit:  ? Drink water, juice, or soup when you can drink without vomiting.  ? Make sure you have little or no nausea before eating solid foods.  General instructions   Have a responsible adult stay with you until you are awake and alert.   Take over-the-counter and prescription medicines only as told by your health care provider.   If you smoke, do not smoke without supervision.   Keep all follow-up visits as told by your health care provider. This is important.  Contact a health care provider if:   You keep feeling nauseous or you keep vomiting.   You feel light-headed.   You develop a rash.   You have a fever.  Get help right away if:   You have trouble breathing.  This information is not intended to replace advice given to you by your health care provider. Make sure you discuss any questions you have  with your health care provider.  Document Released: 07/19/2013 Document Revised: 03/02/2016 Document Reviewed: 01/18/2016  Elsevier Interactive Patient Education  2018 [employer]

## 2018-09-12 NOTE — Op Note (Signed)
PATIENT:  Rebecca Maldonado  PRE-OPERATIVE DIAGNOSIS: Left renal calculi  POST-OPERATIVE DIAGNOSIS: Same  PROCEDURE: 1. Percutaneous nephrostomy sheath placement. 2.  Left percutaneous nephrolithotomy (2.9 cm.) 3. Antegrade double-J stent placement. 4.  Antegrade nephrostogram. 5.  Fluoroscopy time greater than 1 hour and less than 2 hours.  SURGEON:  Garnett Farm  INDICATION: Rebecca Maldonado is a 57 year old female who has had a history of urinary tract infections and underwent upper tract evaluation with ultrasound revealing what appeared to be hydronephrosis and a stone.  This was followed up with a CT scan which revealed a 2 cm stone at the left UPJ causing obstruction and hydronephrosis with a smaller stone in the lower pole of the same kidney.  Preop urine culture was positive for E. coli and she was placed on oral antibiotics and has remained on those until the time of surgery.  She presents today for PCNL  ANESTHESIA:  General  EBL:  Minimal  DRAINS: 6 French, 24 cm double-J stent in the left ureter and an 18 French Foley catheter.  LOCAL MEDICATIONS USED:  None  SPECIMEN: None  Description of procedure: After informed consent the patient was taken to the operating room and administered general endotracheal anesthesia. Once fully anesthetized the patient had an 81 French Foley catheter placed It was then moved from the stretcher onto the operating room table in a prone position with bony prominences padded and chest pads in place. The flank with exiting nephrostomy catheter was then sterilely prepped and draped in standard fashion. An official timeout was then performed.  Using the existing nephrostomy catheter access I passed a 0.038 inch floppy tipped guidewire down the ureter into the bladder under fluoroscopy.  This was left in place and the nephrostomy catheter was removed.  A transverse incision was made over the guidewire and a peel-away coaxial catheter was then passed  over the guidewire and down the ureter under fluoroscopy.  The inner portion of the coaxial system was then removed and a second guidewire was passed through this catheter and down the ureter into the bladder under fluoroscopy.  The coaxial catheter was then removed and one of the guidewires was secured to the drape as a safety guidewire and the second guidewire was used as a working guidewire.  The NephroMax nephrostomy dilating balloon was then passed over the working guidewire into the area of the renal pelvis under fluoroscopy.  It was then inflated using dilute contrast under fluoroscopy until the balloon was fully inflated.  I then passed the 28 French nephrostomy access sheath over the balloon into the area of the renal pelvis under fluoroscopy and then deflated the balloon and removed the dilating balloon.  The 78 French rigid nephroscope was then passed under direct vision through the nephrostomy access sheath.  Clotted blood was evacuated and the stone was easily identified.  It was noted to be quite soft.  I used the Music therapist in ultrasound mode only and was able to break up and evacuate all visible stone from the area of the renal pelvis.  I then switched to the flexible cystoscope and passed this first into the upper pole noted no stone fragments or abnormality.  I then was able to find several renal papilla and what appeared to be the lower pole but no stones could be identified.  I therefore performed an antegrade nephrostogram.  I placed a 16 French Foley catheter through the access sheath and inflated the balloon with 2 cc of  sterile saline allowing this to be pulled back against the nephrostomy sheath and occluding it.  I then injected half-strength Omnipaque contrast and noted the collecting system filled with no extravasation.  I could not identify any filling defects.  I did see what appeared to be a lower pole calyx that appeared to be parallel to the nephrostomy sheath.  I  replaced the flexible cystoscope and using real-time fluoroscopy attempted to locate this but was unable to find this calyx as it appeared to be in a parallel calyx.  The stone was so fate that I was unable to identify it as a filling defect on fluoroscopy either.  There was some bleeding throughout the procedure but it was very mild.  At this point with all visible stone having been evacuated I backloaded the rigid cystoscope over the working guidewire and passed the double-J stent over the guidewire into the bladder with curl being noted in the bladder as the guidewire was removed.  The safety guidewire was then removed as well and I measured the distance to the renal parenchyma out to the edge of the access sheath and marked this on the laparoscopic FloSeal introducer.  I placed this at the previous measured depth and began injecting FloSeal until a full 10 cc at the level of the renal parenchyma and had been injected the nephrostomy tract.  The skin was then closed with a subcuticular 4-0 Monocryl suture and sterile 4 x 4's and a Tegaderm were applied.  The patient was then awakened and taken to the recovery room in stable and satisfactory condition.  She tolerated the procedure well no intraoperative complications.  Needle, sponge and instrument counts were correct x2 at the end of the operation.    PLAN OF CARE: Discharge to home after an overnight stay.  PATIENT DISPOSITION:  PACU - hemodynamically stable.

## 2018-09-12 NOTE — Sedation Documentation (Signed)
Patient is resting comfortably, snoring lightly, in NAD. 

## 2018-09-12 NOTE — Anesthesia Preprocedure Evaluation (Addendum)
Anesthesia Evaluation  Patient identified by MRN, date of birth, ID band Patient awake    Reviewed: Allergy & Precautions, NPO status , Patient's Chart, lab work & pertinent test results  Airway Mallampati: II  TM Distance: >3 FB Neck ROM: Full    Dental no notable dental hx. (+) Dental Advisory Given   Pulmonary neg pulmonary ROS,    Pulmonary exam normal        Cardiovascular negative cardio ROS Normal cardiovascular exam     Neuro/Psych PSYCHIATRIC DISORDERS Depression negative neurological ROS     GI/Hepatic negative GI ROS, Neg liver ROS,   Endo/Other  Morbid obesity  Renal/GU   negative genitourinary   Musculoskeletal negative musculoskeletal ROS (+)   Abdominal   Peds negative pediatric ROS (+)  Hematology negative hematology ROS (+)   Anesthesia Other Findings   Reproductive/Obstetrics negative OB ROS                            Anesthesia Physical Anesthesia Plan  ASA: III  Anesthesia Plan: General   Post-op Pain Management:    Induction: Intravenous  PONV Risk Score and Plan: 4 or greater and Ondansetron, Dexamethasone, Scopolamine patch - Pre-op and Treatment may vary due to age or medical condition  Airway Management Planned: Oral ETT  Additional Equipment:   Intra-op Plan:   Post-operative Plan: Extubation in OR  Informed Consent: I have reviewed the patients History and Physical, chart, labs and discussed the procedure including the risks, benefits and alternatives for the proposed anesthesia with the patient or authorized representative who has indicated his/her understanding and acceptance.   Dental advisory given  Plan Discussed with: CRNA, Anesthesiologist and Surgeon  Anesthesia Plan Comments:         Anesthesia Quick Evaluation

## 2018-09-12 NOTE — Procedures (Signed)
Interventional Radiology Procedure Note  Procedure: Left nephroureteral catheter placement with US and fluoro.  Complications: None  Estimated Blood Loss: Minimal  Findings: Moderate left hydronephrosis due to pelvic stone.  Access obtained from dilated posterior midpole calyx.  Small stone in lower pole calyx.  5 Fr nephroureteral catheter advanced into bladder.  Catheter capped and secured to skin with suture.    Rebecca Maldonado, M.D Pager:  [phone]

## 2018-09-12 NOTE — Sedation Documentation (Signed)
Patient is resting comfortably with eyes closed in NAD. 

## 2018-09-12 NOTE — Anesthesia Postprocedure Evaluation (Signed)
Anesthesia Post Note  Patient: Rebecca Maldonado  Procedure(s) Performed: NEPHROLITHOTOMY PERCUTANEOUS (Left Abdomen)     Patient location during evaluation: PACU Anesthesia Type: General Level of consciousness: sedated Pain management: pain level controlled Vital Signs Assessment: post-procedure vital signs reviewed and stable Respiratory status: spontaneous breathing and respiratory function stable Cardiovascular status: stable Postop Assessment: no apparent nausea or vomiting Anesthetic complications: no    Last Vitals:  Vitals:   09/12/18 1424 09/12/18 1425  BP: (!) 143/66 (!) 143/66  Pulse: 62 61  Resp: 16 16  Temp: 36.7 C 36.7 C  SpO2: 98% 100%    Last Pain:  Vitals:   09/12/18 1425  TempSrc: Oral  PainSc:                  [name] DANIEL

## 2018-09-12 NOTE — Transfer of Care (Signed)
Immediate Anesthesia Transfer of Care Note  Patient: Rebecca Maldonado  Procedure(s) Performed: Procedure(s): NEPHROLITHOTOMY PERCUTANEOUS (Left)  Patient Location: PACU  Anesthesia Type:General  Level of Consciousness:  sedated, patient cooperative and responds to stimulation  Airway & Oxygen Therapy:Patient Spontanous Breathing and Patient connected to face mask oxgen  Post-op Assessment:  Report given to PACU RN and Post -op Vital signs reviewed and stable  Post vital signs:  Reviewed and stable  Last Vitals:  Vitals:   09/12/18 1125 09/12/18 1315  BP: (!) 161/75 (!) 151/73  Pulse: (!) 58 69  Resp: 18 12  Temp:  36.6 C  SpO2: 98% 97%    Complications: No apparent anesthesia complications

## 2018-09-12 NOTE — Consult Note (Signed)
Chief Complaint: Patient was seen in consultation today for left percutaneous nephrostomy/nephroureteral catheter placement   Referring Physician(s): Ottelin,M  Supervising Physician: Richarda Overlie  Patient Status: [facility] - Out-pt  History of Present Illness: Rebecca Maldonado is a 57 y.o. female with history of nephrolithiasis and chronic UTIs who presents today for left percutaneous nephrostomy/nephroureteral catheter placement prior to nephrolithotomy.  Past Medical History:  Diagnosis Date  . Complication of anesthesia    Coughing, sore throat  . Depression   . E-coli UTI 03/18/2018   Noted on urine culture  . Fatigue   . Headache   . Morbid obesity (HCC)   . OA (osteoarthritis)   . Pneumonia   . Psoriasis   . Renal calculus, left   . Seropositive for herpes simplex 2 infection   . Sleep apnea    diagnosis 20 years ago, no longer uses CPAP due to weight loss    Past Surgical History:  Procedure Laterality Date  . CESAREAN SECTION     x2  . DILATION AND CURETTAGE OF UTERUS  2000  . LIVER BIOPSY    . WISDOM TOOTH EXTRACTION      Allergies: Wellbutrin [bupropion]  Medications: Prior to Admission medications   Medication Sig Start Date End Date Taking? Authorizing Provider  Aspirin-Acetaminophen-Caffeine (EXCEDRIN PO) Take by mouth.   Yes [provider]  clobetasol cream (TEMOVATE) 0.05 % Apply 1 application topically 2 (two) times daily.   Yes [provider]  naproxen sodium (ALEVE) 220 MG tablet Take 220 mg by mouth.   Yes [provider]  sulfamethoxazole-trimethoprim (BACTRIM,SEPTRA) 400-80 MG tablet Take 1 tablet by mouth 2 (two) times daily.   Yes [provider]  traMADol (ULTRAM) 50 MG tablet Take 1 tablet (50 mg total) by mouth every 6 (six) hours as needed. 03/15/18  Yes Ofilia Neas, PA-C     History reviewed. No pertinent family history.  Social History   Socioeconomic History  . Marital status: Single   Spouse name: Not on file  . Number of children: Not on file  . Years of education: Not on file  . Highest education level: Not on file  Occupational History  . Not on file  Social Needs  . Financial resource strain: Not on file  . Food insecurity:    Worry: Not on file    Inability: Not on file  . Transportation needs:    Medical: Not on file    Non-medical: Not on file  Tobacco Use  . Smoking status: Never Smoker  . Smokeless tobacco: Never Used  Substance and Sexual Activity  . Alcohol use: Yes    Comment: Occ  . Drug use: Never  . Sexual activity: Not on file  Lifestyle  . Physical activity:    Days per week: Not on file    Minutes per session: Not on file  . Stress: Not on file  Relationships  . Social connections:    Talks on phone: Not on file    Gets together: Not on file    Attends religious service: Not on file    Active member of club or organization: Not on file    Attends meetings of clubs or organizations: Not on file    Relationship status: Not on file  Other Topics Concern  . Not on file  Social History Narrative  . Not on file      Review of Systems currently denies fever, headache, chest pain, dyspnea, cough, abdominal pain,  nausea, vomiting or bleeding.  Does have intermittent flank pain.  Vital Signs: Ht 5\' 5"  (1.651 m)   Wt 280 lb (127 kg)   BMI 46.59 kg/m   Physical Exam awake, alert.  Chest clear to auscultation bilaterally.  Heart with regular rate and rhythm.  Abdomen obese, soft, positive bowel sounds, nontender.  Extremities with full range of motion.  Some mild left flank tenderness to palpation.  Scattered psoriatic plaques/papules noted  Imaging: No results found.  Labs:  CBC: Recent Labs    09/06/18 1538  WBC 5.6  HGB 13.2  HCT 41.2  PLT 311    COAGS: No results for input(s): INR, APTT in the last 8760 hours.  BMP: No results for input(s): NA, K, CL, CO2, GLUCOSE, BUN, CALCIUM, CREATININE, GFRNONAA, GFRAA in the  last 8760 hours.  Invalid input(s): CMP  LIVER FUNCTION TESTS: No results for input(s): BILITOT, AST, ALT, ALKPHOS, PROT, ALBUMIN in the last 8760 hours.  TUMOR MARKERS: No results for input(s): AFPTM, CEA, CA199, CHROMGRNA in the last 8760 hours.  Assessment and Plan: 57 y.o. female with history of nephrolithiasis and chronic UTIs who presents today for left percutaneous nephrostomy/nephroureteral catheter placement prior to nephrolithotomy.Risks and benefits of procedure were discussed with the patient/sig other including, but not limited to, infection, bleeding, significant bleeding causing loss or decrease in renal function or damage to adjacent structures.   All of the patient's questions were answered, patient is agreeable to proceed.  Consent signed and in chart.  LABS PENDING     Thank you for this interesting consult.  I greatly enjoyed meeting Rebecca Maldonado and look forward to participating in their care.  A copy of this report was sent to the requesting provider on this date.  Electronically Signed: D. Jeananne RamaKevin Allred, PA-C 09/12/2018, 8:48 AM   I spent a total of  25 minutes   in face to face in clinical consultation, greater than 50% of which was counseling/coordinating care for left percutaneous nephrostomy/nephroureteral catheter placement

## 2018-09-13 ENCOUNTER — Encounter (HOSPITAL_COMMUNITY): Payer: Self-pay | Admitting: Urology

## 2018-09-13 DIAGNOSIS — N132 Hydronephrosis with renal and ureteral calculous obstruction: Secondary | ICD-10-CM | POA: Diagnosis not present

## 2018-09-13 NOTE — Progress Notes (Signed)
Pt discharged home today per Dr. Vernie Ammonsttelin. Pt's IV site D/C'd and WDL. Pt's VSS. Pt voided x1 post foley removal. Pt provided with home medication list, discharge instructions and prescriptions. Verbalized understanding. Pt left floor via WC in stable condition accompanied by NT.

## 2018-09-13 NOTE — Discharge Summary (Signed)
Physician Discharge Summary      Patient ID: Rebecca Maldonado MRN: [id number] DOB/AGE: June 22, 1961 57 y.o.  Admit date: 09/12/2018 Discharge date: 09/13/2018  Admission Diagnoses: LEFT RENAL CALCULI  Discharge Diagnoses:  Active Problems:   Renal calculus, left   Discharged Condition: good  Hospital Course: She was electively brought to the operating room for a left PCNL.  She had a large stone lodged in the UPJ region and renal pelvis that had caused obstruction.  A second stone was also located in the lower pole of the kidney.  She had been having difficulty with recurrent infections but they were not due to urease forming bacteria.  She underwent PCNL and I was able to completely evacuate all of the very soft stone located in the renal pelvis but was unable to access the lower pole as it appeared to be in a parallel calyx.  Her postoperative CT scan revealed all of the large obstructing stone in the renal pelvis had been removed with a remaining stone in the lower pole.  She has done well overnight.  No flank pain or abdominal discomfort and is tolerating a regular diet.  Her urine has cleared.  Her Foley catheter will be removed and she will be discharged home with follow-up as an outpatient to schedule ureteroscopic management of the remaining lower pole stone.  Discharge Exam: Blood pressure 135/63, pulse 77, temperature 98.4 F (36.9 C), temperature source Oral, resp. rate 16, height 5\' 5"  (1.651 m), weight 127 kg, SpO2 97 %. General: Awake, alert and in no apparent distress. Chest: Normal respiratory effort. Cardiovascular: Regular rate and rhythm. Abdomen: Soft, nontender, nondistended. Back: Her dressing is intact and dry.  There are no flank ecchymoses. GU: Foley catheter indwelling and draining nearly clear urine.  No clots.   Disposition: She will be discharged home with a prescription for pain medication but no antibiotics.  I will reassess her urine when she returns as an  outpatient.   Allergies as of 09/13/2018      Reactions   Wellbutrin [bupropion] Hives      Medication List    TAKE these medications   clobetasol cream 0.05 % Commonly known as:  TEMOVATE Apply 1 application topically 2 (two) times daily.   EXCEDRIN PO Take by mouth.   HYDROcodone-acetaminophen 10-325 MG tablet Commonly known as:  NORCO Take 1-2 tablets by mouth every 4 (four) hours as needed for moderate pain. Maximum dose per 24 hours - 8 pills   naproxen sodium 220 MG tablet Commonly known as:  ALEVE Take 220 mg by mouth.   sulfamethoxazole-trimethoprim 400-80 MG tablet Commonly known as:  BACTRIM,SEPTRA Take 1 tablet by mouth 2 (two) times daily.   traMADol 50 MG tablet Commonly known as:  ULTRAM Take 1 tablet (50 mg total) by mouth every 6 (six) hours as needed.      Follow-up Information    [facility] On 09/19/2018.   Why:  For your appiontment at 8:15 Contact information: [address] [place] [phone]          Signed: Garnett FarmOTTELIN,Kenyana Husak C 09/13/2018, 6:53 AM

## 2018-09-21 ENCOUNTER — Other Ambulatory Visit: Payer: Self-pay | Admitting: Urology

## 2018-09-27 ENCOUNTER — Encounter (HOSPITAL_COMMUNITY): Payer: Self-pay

## 2018-09-27 NOTE — Patient Instructions (Signed)
Your procedure is scheduled on: Friday, Dec. 20, 2019   Surgery Time:  1:30PM-3:00PM   [facility] Main  Entr[facility]    Report to admitting at 11:30 AM   Call this number if you have problems the morning of surgery [phone]   Do not eat food or drink liquids :After Midnight.   Brush your teeth the morning of surgery.   Do NOT smoke after Midnight   Take these medicines the morning of surgery with A SIP OF WATER: None                               You may not have any metal on your body including hair pins, jewelry, and body piercings             Do not wear make-up, lotions, powders, perfumes/cologne, or deodorant             Do not wear nail polish.  Do not shave  48 hours prior to surgery.                Do not bring valuables to the hospital.  IS NOT             RESPONSIBLE   FOR VALUABLES.   Contacts, dentures or bridgework may not be worn into surgery.    Patients discharged the day of surgery will not be allowed to drive home.   Special Instructions: Bring a copy of your healthcare power of attorney and living will documents         the day of surgery if you haven't scanned them in before.              Please read over the following fact sheets you were given:  [facility] - Preparing [facility] Before surgery, you can play an important role.  Because skin is not sterile, your skin needs to be as free of germs as possible.  You can reduce the number of germs on your skin by washing with CHG (chlorahexidine gluconate) soap before surgery.  CHG is an antiseptic cleaner which kills germs and bonds with the skin to continue killing germs even after washing. Please DO NOT use if you have an allergy to CHG or antibacterial soaps.  If your skin becomes reddened/irritated stop using the CHG and inform your nurse when you arrive at Short Stay. Do not shave (including legs and underarms) for at least 48 hours prior to the first CHG shower.  You  may shave your face/neck.  Please follow these instructions carefully:  1.  Shower with CHG Soap the night before surgery and the  morning of surgery.  2.  If you choose to wash your hair, wash your hair first as usual with your normal  shampoo.  3.  After you shampoo, rinse your hair and body thoroughly to remove the shampoo.                             4.  Use CHG as you would any other liquid soap.  You can apply chg directly to the skin and wash.  Gently with a scrungie or clean washcloth.  5.  Apply the CHG Soap to your body ONLY FROM THE NECK DOWN.   Do   not use on face/ open  Wound or open sores. Avoid contact with eyes, ears mouth and   genitals (private parts).                       Wash face,  Genitals (private parts) with your normal soap.             6.  Wash thoroughly, paying special attention to the area where your    surgery  will be performed.  7.  Thoroughly rinse your body with warm water from the neck down.  8.  DO NOT shower/wash with your normal soap after using and rinsing off the CHG Soap.                9.  Pat yourself dry with a clean towel.            10.  Wear clean pajamas.            11.  Place clean sheets on your bed the night of your first shower and do not  sleep with pets. Day of Surgery : Do not apply any lotions/deodorants the morning of surgery.  Please wear clean clothes to the hospital/surgery center.  FAILURE TO FOLLOW THESE INSTRUCTIONS MAY RESULT IN THE CANCELLATION OF YOUR SURGERY  PATIENT SIGNATURE_________________________________  NURSE SIGNATURE__________________________________  ________________________________________________________________________

## 2018-09-28 ENCOUNTER — Encounter (HOSPITAL_COMMUNITY): Payer: Self-pay

## 2018-09-28 ENCOUNTER — Other Ambulatory Visit: Payer: Self-pay

## 2018-09-28 ENCOUNTER — Encounter (HOSPITAL_COMMUNITY)
Admission: RE | Admit: 2018-09-28 | Discharge: 2018-09-28 | Disposition: A | Discharge status: Home / Self Care | Payer: BLUE CROSS/BLUE SHIELD | Source: Ambulatory Visit | Attending: Urology | Admitting: Urology

## 2018-09-28 DIAGNOSIS — Z01812 Encounter for preprocedural laboratory examination: Secondary | ICD-10-CM | POA: Diagnosis present

## 2018-09-28 HISTORY — DX: Cramp and spasm: R25.2

## 2018-09-28 HISTORY — DX: Urinary tract infection, site not specified: N39.0

## 2018-09-28 HISTORY — DX: Cellulitis, unspecified: L03.90

## 2018-09-28 LAB — CBC
HCT: 40.8 % (ref 36.0–46.0)
Hemoglobin: 12.8 g/dL (ref 12.0–15.0)
MCH: 29.4 pg (ref 26.0–34.0)
MCHC: 31.4 g/dL (ref 30.0–36.0)
MCV: 93.8 fL (ref 80.0–100.0)
Platelets: 311 10*3/uL (ref 150–400)
RBC: 4.35 MIL/uL (ref 3.87–5.11)
RDW: 13.2 % (ref 11.5–15.5)
WBC: 4.6 10*3/uL (ref 4.0–10.5)
nRBC: 0 % (ref 0.0–0.2)

## 2018-09-29 MED ORDER — DEXTROSE 5 % IV SOLN
3.0000 g | INTRAVENOUS | Status: AC
  Administered 2018-09-30: 3 g via INTRAVENOUS
  Filled 2018-09-29: qty 3

## 2018-09-29 NOTE — H&P (Signed)
HPI: Rebecca Maldonado is a 57 year-old female who presents for 2nd look ureteroscopy after left PCNL for retained stone.  09/19/18: She underwent left PCNL on December 2. All visible stones within the renal pelvis were removed. Follow-up CT imaging did indicate a left lower pole calculi but this was not able to be accessed during the above-named procedure. A stent was left in place. Presents today for reevaluation in anticipation of left ureteroscopy to treat the lower pole calculi to effectively render the patient's stone free. Pt concurrently had E. coli UTI requiring treatment for that based on sensitivities of preprocedural urine culture. She is not on antimicrobial therapy at this time.   She has not passed any stone material since hospital discharge. Denies gross hematuria/dysuria. She's had intermittent increased urinary frequency and urgency. Also complaining of intermittent fatigue but denies fever/chills. She c/o intermittent left lower back pain, worse at night.   The stone was on the left side. She had Stent and PCNL for treatment of her renal calculi. Patient denies Ureteroscopy and ESWL. This procedure was done 09/12/2018. She did not pass a stone since the last office visit. This is her first kidney stone. She does have a stent in place.   She is currently having back pain. She denies having flank pain, groin pain, nausea, vomiting, fever, and chills.   She does not have dysuria. She does have urgency. She does have frequency.     ALLERGIES: buPROPion - Hives Wellbutrin    MEDICATIONS: Aleve  Azo  Cystex Plus  Taltz Autoinjector     GU PSH: Catheterize For Residual - 09/01/2018 Cystoscopy - 08/18/2018 Locm 300-399Mg /Ml Iodine,1Ml - 08/24/2018 Percut Stone Removal >2cm, Left - 09/12/2018 Placement of ureteral stent, percutaneous, Left - 09/12/2018    NON-GU PSH: C-Section D&&C    GU PMH: Hydronephrosis, Left, She does have mild left Hydro. She may have some renal calculi in  her kidney as well. This needs to be evaluated further and therefore I will obtain a CT scan with and without contrast. - 08/18/2018 Chronic cystitis (w/o hematuria), She appears to be having recurrent UTIs. I am going to evaluate her upper tract with an ultrasound when she returns and then perform cystoscopy. We will then discuss options for management of her recurrent infections. - 08/02/2018    NON-GU PMH: Pyuria/other UA findings, She currently has bacteriuria and pyuria with nitrite positivity. I will go ahead and start her on empiric antibiotics and culture her urine. - 08/02/2018    FAMILY HISTORY: 1 son - Son 3 daughters - Daughter Asthma - Father COPD (chronic obstructive pulmonary disease) with - Father gallstones - Mother Hepatitis - Father, Mother psoriasis - Father   SOCIAL HISTORY: Marital Status: Divorced Preferred Language: English; Ethnicity: Not Hispanic Or Latino; Race: White Current Smoking Status: Patient has never smoked.   Tobacco Use Assessment Completed: Used Tobacco in last 30 days? Social Drinker.  Drinks 2 caffeinated drinks per day.    REVIEW OF SYSTEMS:    GU Review Female:   Patient denies leakage of urine, have to strain to urinate, burning /pain with urination, being pregnant, frequent urination, get up at night to urinate, trouble starting your stream, stream starts and stops, and hard to postpone urination.  Gastrointestinal (Upper):   Patient denies nausea, vomiting, and indigestion/ heartburn.  Gastrointestinal (Lower):   Patient denies diarrhea and constipation.  Constitutional:   Patient reports fatigue. Patient denies fever, night sweats, and weight loss.  Skin:   Patient reports  skin rash/ lesion and itching.   Eyes:   Patient denies blurred vision and double vision.  Ears/ Nose/ Throat:   Patient denies sore throat and sinus problems.  Hematologic/Lymphatic:   Patient denies swollen glands and easy bruising.  Cardiovascular:   Patient denies  leg swelling and chest pains.  Respiratory:   Patient denies cough and shortness of breath.  Endocrine:   Patient denies excessive thirst.  Musculoskeletal:   Patient reports back pain. Patient denies joint pain.  Neurological:   Patient denies headaches and dizziness.  Psychologic:   Patient denies depression and anxiety.   VITAL SIGNS:   BP 134/82 mmHg  Pulse 68 /min  Temperature 98.3 F / 36.8 C   MULTI-SYSTEM PHYSICAL EXAMINATION:    Constitutional: Well-nourished. No physical deformities. Normally developed. Good grooming.  Neck: Neck symmetrical, not swollen. Normal tracheal position.  Respiratory: No labored breathing, no use of accessory muscles.   Cardiovascular: Normal temperature, normal extremity pulses, no swelling, no varicosities.  Skin: Skin lesion. No paleness, no jaundice, no cyanosis. No ulcer, no rash. Severe psoriasis on torso  Neurologic / Psychiatric: Oriented to time, oriented to place, oriented to person. No depression, no anxiety, no agitation.  Gastrointestinal: Obese abdomen. No mass, no tenderness, no rigidity. No palpable tenderness. Nephrostomy tube site well healed.     PAST DATA REVIEWED:  Source Of History:  Patient   PROCEDURES:          Urinalysis w/Scope Dipstick Dipstick Cont'd Micro  Color: Yellow Bilirubin: Neg mg/dL WBC/hpf: >16/XWR>60/hpf  Appearance: Cloudy Ketones: Neg mg/dL RBC/hpf: 10 - 60/AVW20/hpf  Specific Gravity: 1.025 Blood: 3+ ery/uL Bacteria: Many (>50/hpf)  pH: 6.0 Protein: 2+ mg/dL Cystals: NS (Not Seen)  Glucose: Neg mg/dL Urobilinogen: 0.2 mg/dL Casts: NS (Not Seen)    Nitrites: Positive Trichomonas: Not Present    Leukocyte Esterase: 3+ leu/uL Mucous: Not Present      Epithelial Cells: 0 - 5/hpf      Yeast: NS (Not Seen)      Sperm: Not Present    ASSESSMENT/PLAN:  A repeat urine c/s was sent and was found to be positive for E coli that was sensitive to Bactrim.  She was placed on that had and has remained on this until the time  of surgery.  It was also sensitive to Ancef.   She is already aware of need to go ahead and set up for URS to treat her remaining stone burden. I'll also provide an RX for pain medication should she need it for severe pain exacerbations. For ureteroscopy as well as risks and complications were discussed with the patient. These include but are not limited to infection, injury to the urinary tract i.e. ureteral disruption/evulsion, bleeding, stent discomfort, anesthetic complications, among others.

## 2018-09-30 ENCOUNTER — Ambulatory Visit (HOSPITAL_COMMUNITY): Payer: BLUE CROSS/BLUE SHIELD | Admitting: Anesthesiology

## 2018-09-30 ENCOUNTER — Encounter (HOSPITAL_COMMUNITY): Payer: Self-pay | Admitting: Anesthesiology

## 2018-09-30 ENCOUNTER — Ambulatory Visit (HOSPITAL_COMMUNITY): Payer: BLUE CROSS/BLUE SHIELD

## 2018-09-30 ENCOUNTER — Encounter (HOSPITAL_COMMUNITY)
Admission: RE | Disposition: A | Discharge status: Home / Self Care | Payer: Self-pay | Source: Home / Self Care | Attending: Urology

## 2018-09-30 ENCOUNTER — Ambulatory Visit (HOSPITAL_COMMUNITY)
Admission: RE | Admit: 2018-09-30 | Discharge: 2018-09-30 | Disposition: A | Discharge status: Home / Self Care | Payer: BLUE CROSS/BLUE SHIELD | Attending: Urology | Admitting: Urology

## 2018-09-30 DIAGNOSIS — Z87440 Personal history of urinary (tract) infections: Secondary | ICD-10-CM | POA: Diagnosis not present

## 2018-09-30 DIAGNOSIS — Z888 Allergy status to other drugs, medicaments and biological substances status: Secondary | ICD-10-CM | POA: Insufficient documentation

## 2018-09-30 DIAGNOSIS — Z6841 Body Mass Index (BMI) 40.0 and over, adult: Secondary | ICD-10-CM | POA: Diagnosis not present

## 2018-09-30 DIAGNOSIS — N200 Calculus of kidney: Secondary | ICD-10-CM | POA: Diagnosis not present

## 2018-09-30 HISTORY — PX: CYSTOSCOPY/URETEROSCOPY/HOLMIUM LASER/STENT PLACEMENT: SHX6546

## 2018-09-30 SURGERY — CYSTOSCOPY/URETEROSCOPY/HOLMIUM LASER/STENT PLACEMENT
Anesthesia: General | Laterality: Left

## 2018-09-30 MED ORDER — OXYCODONE HCL 5 MG PO TABS
5.0000 mg | ORAL_TABLET | Freq: Once | ORAL | Status: AC | PRN
  Administered 2018-09-30: 5 mg via ORAL

## 2018-09-30 MED ORDER — ONDANSETRON HCL 4 MG/2ML IJ SOLN
INTRAMUSCULAR | Status: DC | PRN
  Administered 2018-09-30: 4 mg via INTRAVENOUS

## 2018-09-30 MED ORDER — MIDAZOLAM HCL 5 MG/5ML IJ SOLN
INTRAMUSCULAR | Status: DC | PRN
  Administered 2018-09-30: 2 mg via INTRAVENOUS

## 2018-09-30 MED ORDER — ACETAMINOPHEN 325 MG PO TABS: 325.0000 mg | ORAL_TABLET | ORAL | Status: DC | PRN

## 2018-09-30 MED ORDER — LACTATED RINGERS IV SOLN
INTRAVENOUS | Status: DC
  Administered 2018-09-30: 12:00:00 via INTRAVENOUS

## 2018-09-30 MED ORDER — DEXAMETHASONE SODIUM PHOSPHATE 10 MG/ML IJ SOLN
INTRAMUSCULAR | Status: DC | PRN
  Administered 2018-09-30: 5 mg via INTRAVENOUS

## 2018-09-30 MED ORDER — LIDOCAINE 2% (20 MG/ML) 5 ML SYRINGE
INTRAMUSCULAR | Status: DC | PRN
  Administered 2018-09-30: 100 mg via INTRAVENOUS

## 2018-09-30 MED ORDER — PROPOFOL 10 MG/ML IV BOLUS
INTRAVENOUS | Status: DC | PRN
  Administered 2018-09-30: 200 mg via INTRAVENOUS

## 2018-09-30 MED ORDER — HYDROCODONE-ACETAMINOPHEN 10-325 MG PO TABS: 1.0000 | ORAL_TABLET | ORAL | 0 refills | Status: AC | PRN

## 2018-09-30 MED ORDER — IOHEXOL 300 MG/ML  SOLN
INTRAMUSCULAR | Status: DC | PRN
  Administered 2018-09-30: 10 mL

## 2018-09-30 MED ORDER — ACETAMINOPHEN 160 MG/5ML PO SOLN: 325.0000 mg | ORAL | Status: DC | PRN

## 2018-09-30 MED ORDER — FENTANYL CITRATE (PF) 100 MCG/2ML IJ SOLN
INTRAMUSCULAR | Status: DC | PRN
  Administered 2018-09-30 (×2): 50 ug via INTRAVENOUS

## 2018-09-30 MED ORDER — OXYCODONE HCL 5 MG/5ML PO SOLN
5.0000 mg | Freq: Once | ORAL | Status: AC | PRN
  Filled 2018-09-30: qty 5

## 2018-09-30 MED ORDER — SODIUM CHLORIDE 0.9 % IR SOLN
Status: DC | PRN
  Administered 2018-09-30: 3000 mL

## 2018-09-30 MED ORDER — MEPERIDINE HCL 50 MG/ML IJ SOLN: 6.2500 mg | INTRAMUSCULAR | Status: DC | PRN

## 2018-09-30 MED ORDER — ONDANSETRON HCL 4 MG/2ML IJ SOLN: 4.0000 mg | Freq: Once | INTRAMUSCULAR | Status: DC | PRN

## 2018-09-30 MED ORDER — FENTANYL CITRATE (PF) 100 MCG/2ML IJ SOLN
25.0000 ug | INTRAMUSCULAR | Status: DC | PRN
  Administered 2018-09-30 (×2): 50 ug via INTRAVENOUS

## 2018-09-30 MED ORDER — OXYCODONE HCL 5 MG PO TABS
ORAL_TABLET | ORAL | Status: AC
  Filled 2018-09-30: qty 1

## 2018-09-30 MED ORDER — ACETAMINOPHEN 10 MG/ML IV SOLN: 1000.0000 mg | Freq: Once | INTRAVENOUS | Status: DC | PRN

## 2018-09-30 MED ORDER — MIDAZOLAM HCL 2 MG/2ML IJ SOLN
INTRAMUSCULAR | Status: AC
  Filled 2018-09-30: qty 2

## 2018-09-30 MED ORDER — FENTANYL CITRATE (PF) 100 MCG/2ML IJ SOLN
INTRAMUSCULAR | Status: AC
  Filled 2018-09-30: qty 2

## 2018-09-30 MED ORDER — CYCLOBENZAPRINE HCL 5 MG PO TABS
5.0000 mg | ORAL_TABLET | Freq: Three times a day (TID) | ORAL | Status: DC | PRN
  Administered 2018-09-30: 5 mg via ORAL
  Filled 2018-09-30 (×2): qty 1

## 2018-09-30 SURGICAL SUPPLY — 20 items
BAG URO CATCHER STRL LF (MISCELLANEOUS) ×2 IMPLANT
BASKET ZERO TIP 1.9FR (BASKET) IMPLANT
BASKET ZERO TIP NITINOL 2.4FR (BASKET) ×2 IMPLANT
CATH INTERMIT  6FR 70CM (CATHETERS) ×2 IMPLANT
CLOTH BEACON ORANGE TIMEOUT ST (SAFETY) ×2 IMPLANT
CONT SPEC 4OZ CLIKSEAL STRL BL (MISCELLANEOUS) ×2 IMPLANT
COVER WAND RF STERILE (DRAPES) IMPLANT
EXTRACTOR STONE 1.7FRX115CM (UROLOGICAL SUPPLIES) IMPLANT
FIBER LASER TRAC TIP (UROLOGICAL SUPPLIES) ×2 IMPLANT
GAUZE SPONGE 4X4 16PLY XRAY LF (GAUZE/BANDAGES/DRESSINGS) ×2 IMPLANT
GLOVE BIOGEL M 8.0 STRL (GLOVE) ×2 IMPLANT
GOWN STRL REUS W/TWL XL LVL3 (GOWN DISPOSABLE) ×2 IMPLANT
GUIDEWIRE ANG ZIPWIRE 038X150 (WIRE) IMPLANT
GUIDEWIRE STR DUAL SENSOR (WIRE) ×2 IMPLANT
MANIFOLD NEPTUNE II (INSTRUMENTS) ×2 IMPLANT
PACK CYSTO (CUSTOM PROCEDURE TRAY) ×2 IMPLANT
SHEATH URETERAL 12FRX28CM (UROLOGICAL SUPPLIES) ×2 IMPLANT
SHEATH URETERAL 12FRX35CM (MISCELLANEOUS) IMPLANT
TUBING CONNECTING 10 (TUBING) ×2 IMPLANT
TUBING UROLOGY SET (TUBING) ×2 IMPLANT

## 2018-09-30 NOTE — Op Note (Signed)
PATIENT:  Rebecca Maldonado  PRE-OPERATIVE DIAGNOSIS: Left renal calculus   POST-OPERATIVE DIAGNOSIS: Same  PROCEDURE:  1. Removal of left ureteral stent 2. Left retrograde pyelogram with interpretation 3. Left ureteroscopy with stone extraction and laser lithotripsy  SURGEON: Garnett FarmMark C Ranier Coach, MD  INDICATION: [name] is a 57 year old female who was found to have a large left renal calculus and therefore underwent a left PCNL on 09/12/2018 with all visible stone from the renal pelvis removed.  The renal pelvic stone was causing obstruction.  There was a stone located in the lower pole but the access was in a parallel calyx and this could not be visualized during the PCNL.  A stent was left in place and she is brought back to the operating room for removal of the remaining stone.  She has had difficulty with recurrent E. coli UTIs but has not had any infections from urease forming bacteria.  We did discuss the fact that stone can harbor bacteria and therefore the need to completely rid her of all remaining stone.  She understands and is elected to proceed.  ANESTHESIA:  General  EBL:  Minimal  DRAINS: None  SPECIMEN: Stone removed from the kidney crushed in saline and sent for culture  DESCRIPTION OF PROCEDURE: The patient was taken to the major OR and placed on the table. General anesthesia was administered and then the patient was moved to the dorsal lithotomy position. The genitalia was sterilely prepped and draped. An official timeout was performed.  Initially the 23 French cystoscope with 30 lens was passed under direct vision into the bladder. The bladder was then fully inspected. It was noted be free of any tumors, stones or inflammatory lesions.  The stent exiting the left ureteral orifice was identified.  It was grasped with alligator forceps and withdrawn through the urethral meatus.  I then passed a 0.038 inch sensor guidewire through the stent and up into the area the renal pelvis  under fluoroscopic control.  The stent was then removed.  Over this guidewire I passed a ureteral access sheath.  Because the stent had been in for some time the access sheath passed easily through the intramural ureter and up the ureter.  Once in place at the [facility] I performed a retrograde pyelogram.  Retrograde pyelography was performed by injecting full-strength Omnipaque contrast through the inner portion of the ureteral access sheath after the guidewire had been removed.  This revealed the intrarenal collecting system which appeared normal.  I did see what appeared to possibly be a filling defect in the lower pole calyx.  No other abnormalities were identified.  I therefore removed the inner portion of the access sheath and proceeded with ureteroscopy.  The 6 French dual-lumen flexible, digital ureteroscope was then passed through the access sheath and into the area of the renal pelvis.  I began inspecting each calyx and noted that the upper pole calyces were free of any stones but I found the stone in the middle pole calyx.  This was then engaged in a 0 tip nitinol basket and began to fragment due to its very soft nature.  I was able to pull out the largest portion of this stone and this was placed in a small amount of sterile saline.  I then used multiple passes to remove all the remaining portions of the stone.  I then continued my inspection of the calyces and found the stones in the lower pole calyx that had been previously identified on imaging  studies and first tried to engage the stones in the basket but they were too soft and fragmented.  I then tried using a 10 cc syringe hooked to the largest port of the ureteroscope and tried to aspirate the fragments but they were a little bit too large so what I elected to do was used a 200 m holmium laser fiber and used a technique of popcorning the stone fragments had a low energy high rate resulting in essentially dusted stone.  I then reinspected all the  lower pole calyces and could find no fragments larger than about 0.5 mm.  Feeling that this was adequate to allow the stone fragments easy passage I reinspected each calyx and noted no other fragments and therefore placed to the ureteroscope at the tip of the access sheath and withdrew the access sheath under direct vision down the ureter noting no abnormalities of the ureter and stones.  Because I was able to perform this with minimal trauma to the ureter I felt there was no need to leave a stent.  I therefore drained the bladder and the patient was awakened and taken to the recovery room in stable and satisfactory condition.  The patient tolerated the procedure well no intraoperative complications.  PLAN OF CARE: Discharge to home after PACU  PATIENT DISPOSITION:  PACU - hemodynamically stable.

## 2018-09-30 NOTE — Anesthesia Postprocedure Evaluation (Signed)
Anesthesia Post Note  Patient: Rebecca Maldonado  Procedure(s) Performed: CYSTOSCOPY/STENT REMOVAL/RETROGRADE/URETEROSCOPY/HOLMIUM LASER/BASKET RETRIVAL OF STONE (Left )     Patient location during evaluation: PACU Anesthesia Type: General Level of consciousness: awake Pain management: pain level controlled Vital Signs Assessment: post-procedure vital signs reviewed and stable Respiratory status: spontaneous breathing Cardiovascular status: stable Postop Assessment: no apparent nausea or vomiting Anesthetic complications: no    Last Vitals:  Vitals:   09/30/18 1445 09/30/18 1604  BP: (!) 141/70 140/68  Pulse: (!) 58 (!) 58  Resp: 14 16  Temp:  37.1 C  SpO2: 97% 100%    Last Pain:  Vitals:   09/30/18 1604  TempSrc:   PainSc: 5    Pain Goal: Patients Stated Pain Goal: 4 (09/30/18 1231)               Caren MacadamJohn F Briana Newman Jr

## 2018-09-30 NOTE — Discharge Instructions (Signed)
Post stone removal/stent placement surgery instructions (no stent used in this case)   Definitions:  Ureter: The duct that transports urine from the kidney to the bladder. Stent: A plastic hollow tube that is placed into the ureter, from the kidney to the bladder to prevent the ureter from swelling shut.  General instructions:  Despite the fact that no skin incisions were used, the area around the ureter and bladder is raw and irritated. The stent is a foreign body which will further irritate the bladder wall. This irritation is manifested by increased frequency of urination, both day and night, and by an increase in the urge to urinate. In some, the urge to urinate is present almost always. Sometimes the urge is strong enough that you may not be able to stop your self from urinating. The only real cure is to remove the stent and then give time for the bladder wall to heal which can't be done until the danger of the ureter swelling shut has passed. (This varies from 2-21 days).  You may see some blood in your urine while the stent is in place and a few days afterward. Do not be alarmed, even if the urine is clear for a while. Get off your feet and drink lots of fluids until clearing occurs. If you start to pass clots or don't improve, call us.  If you have a string coming from your urethra:  The stent string is attached to your ureteral stent.  Do not pull on thisIf you have a string coming from your urethra:  The stent string is attached to your ureteral stent.  Do not pull on this.  Diet:  You may return to your normal diet immediately. Because of the raw surface of your bladder, alcohol, spicy foods, foods high in acid and drinks with caffeine may cause irritation or frequency and should be used in moderation. To keep your urine flowing freely and avoid constipation, drink plenty of fluids during the day (8-10 glasses). Tip: Avoid cranberry juice because it is very acidic.  Activity:  Your  physical activity doesn't need to be restricted. However, if you are very active, you may see some blood in the urine. We suggest that you reduce your activity under the circumstances until the bleeding has stopped.  Bowels:  It is important to keep your bowels regular during the postoperative period. Straining with bowel movements can cause bleeding. A bowel movement every other day is reasonable. Use a mild laxative if needed, such as milk of magnesia 2-3 tablespoons, or 2 Dulcolax tablets. Call if you continue to have problems. If you had been taking narcotics for pain, before, during or after your surgery, you may be constipated. Take a laxative if necessary.     Medication:  You should resume your pre-surgery medications unless told not to. DO NOT RESUME YOUR ASPIRIN, or any other medicines like ibuprofen, motrin, excedrin, advil, aleve, vitamin E, fish oil as these can all cause bleeding x 7 days. In addition you may be given an antibiotic to prevent or treat infection. Antibiotics are not always necessary. All medication should be taken as prescribed until the bottles are finished unless you are having an unusual reaction to one of the drugs.  Problems you should report to us:  a. Fever greater than 101F. b. Heavy bleeding, or clots (see notes above about blood in urine). c. Inability to urinate. d. Drug reactions (hives, rash, nausea, vomiting, diarrhea). e. Severe burning or pain with urination that  is not improving.  Followup:  You will need a followup appointment to monitor your progress in most cases. Please call the office for this appointment when you get home if your appointment has not already been scheduled. Usually the first appointment will be about 5-14 days after your surgery and if you have a stent in place it will likely be removed at that time.

## 2018-09-30 NOTE — Anesthesia Preprocedure Evaluation (Addendum)
Anesthesia Evaluation  Patient identified by MRN, date of birth, ID band Patient awake    Reviewed: Allergy & Precautions, NPO status , Patient's Chart, lab work & pertinent test results  Airway Mallampati: II  TM Distance: >3 FB Neck ROM: Full    Dental no notable dental hx. (+) Dental Advisory Given, Teeth Intact   Pulmonary    Pulmonary exam normal        Cardiovascular negative cardio ROS Normal cardiovascular exam Rhythm:Regular Rate:Normal     Neuro/Psych PSYCHIATRIC DISORDERS Depression    GI/Hepatic negative GI ROS, Neg liver ROS,   Endo/Other  Morbid obesity  Renal/GU   negative genitourinary   Musculoskeletal   Abdominal (+) + obese,   Peds negative pediatric ROS (+)  Hematology negative hematology ROS (+)   Anesthesia Other Findings   Reproductive/Obstetrics negative OB ROS                             Anesthesia Physical  Anesthesia Plan  ASA: III  Anesthesia Plan: General   Post-op Pain Management:    Induction: Intravenous  PONV Risk Score and Plan: 4 or greater and Ondansetron, Dexamethasone, Scopolamine patch - Pre-op and Treatment may vary due to age or medical condition  Airway Management Planned: LMA  Additional Equipment:   Intra-op Plan:   Post-operative Plan: Extubation in OR  Informed Consent: I have reviewed the patients History and Physical, chart, labs and discussed the procedure including the risks, benefits and alternatives for the proposed anesthesia with the patient or authorized representative who has indicated his/her understanding and acceptance.   Dental advisory given  Plan Discussed with: CRNA  Anesthesia Plan Comments:        Anesthesia Quick Evaluation

## 2018-09-30 NOTE — Transfer of Care (Signed)
Immediate Anesthesia Transfer of Care Note  Patient: Rebecca Maldonado  Procedure(s) Performed: CYSTOSCOPY/STENT REMOVAL/RETROGRADE/URETEROSCOPY/HOLMIUM LASER/BASKET RETRIVAL OF STONE (Left )  Patient Location: PACU  Anesthesia Type:General  Level of Consciousness: awake, alert  and oriented  Airway & Oxygen Therapy: Patient Spontanous Breathing and Patient connected to face mask oxygen  Post-op Assessment: Report given to RN and Post -op Vital signs reviewed and stable  Post vital signs: Reviewed and stable  Last Vitals:  Vitals Value Taken Time  BP 146/78 09/30/2018  1:52 PM  Temp    Pulse 72 09/30/2018  1:54 PM  Resp 19 09/30/2018  1:54 PM  SpO2 100 % 09/30/2018  1:54 PM  Vitals shown include unvalidated device data.  Last Pain:  Vitals:   09/30/18 1231  TempSrc:   PainSc: 0-No pain      Patients Stated Pain Goal: 4 (09/30/18 1231)  Complications: No apparent anesthesia complications

## 2018-09-30 NOTE — Anesthesia Postprocedure Evaluation (Deleted)
Anesthesia Post Note  Patient: Rebecca Maldonado  Procedure(s) Performed: CYSTOSCOPY/STENT REMOVAL/RETROGRADE/URETEROSCOPY/HOLMIUM LASER/BASKET RETRIVAL OF STONE (Left )     Patient location during evaluation: PACU Anesthesia Type: General Level of consciousness: awake Pain management: pain level not controlled Vital Signs Assessment: post-procedure vital signs reviewed and stable Respiratory status: spontaneous breathing Cardiovascular status: stable Postop Assessment: no apparent nausea or vomiting Anesthetic complications: no    Last Vitals:  Vitals:   09/30/18 1415 09/30/18 1425  BP: (!) 144/72   Pulse: 62 64  Resp: 12 12  Temp:    SpO2: 100% 98%    Last Pain:  Vitals:   09/30/18 1425  TempSrc:   PainSc: 7    Pain Goal: Patients Stated Pain Goal: 4 (09/30/18 1231)               Rebecca MacadamJohn F Camielle Sizer Jr

## 2018-09-30 NOTE — Anesthesia Procedure Notes (Signed)
Procedure Name: LMA Insertion Performed by: Kizzie Fantasiaarver, Rosevelt Luu J, CRNA Pre-anesthesia Checklist: Patient identified, Emergency Drugs available, Suction available, Patient being monitored and Timeout performed Patient Re-evaluated:Patient Re-evaluated prior to induction Oxygen Delivery Method: Circle system utilized Preoxygenation: Pre-oxygenation with 100% oxygen Induction Type: IV induction Ventilation: Mask ventilation without difficulty LMA: LMA inserted LMA Size: 4.0 Number of attempts: 1 Dental Injury: Teeth and Oropharynx as per pre-operative assessment

## 2018-10-01 ENCOUNTER — Encounter (HOSPITAL_COMMUNITY): Payer: Self-pay | Admitting: Urology

## 2018-10-03 LAB — URINE CULTURE: Culture: 60000 — AB

## 2019-11-22 IMAGING — XA IR URETURAL STENT LEFT NEW ACCESS W/O SEP NEPHROSTOMY CATH
9 series · 10 of 10 positions shown · non-contrast
Comparison: None.

INDICATION: 57-year-old with large obstructing left renal pelvic stone and
scheduled for percutaneous nephrolithotomy procedure. Patient needs
percutaneous access for the nephrolithotomy procedure.

EXAM:
PLACEMENT OF LEFT NEPHROURETERAL CATHETER WITH ULTRASOUND AND
FLUOROSCOPIC GUIDANCE

[Series 1: care single · 1 of 1 slices shown (1 of 8)]
[im 1/1]
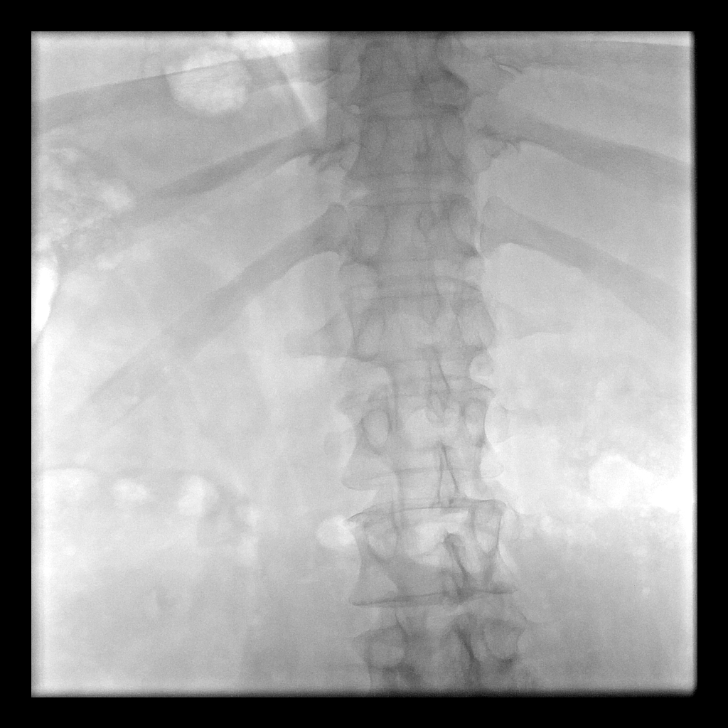

[Series 2: care single · 1 of 1 slices shown (2 of 8)]
[im 1/1]
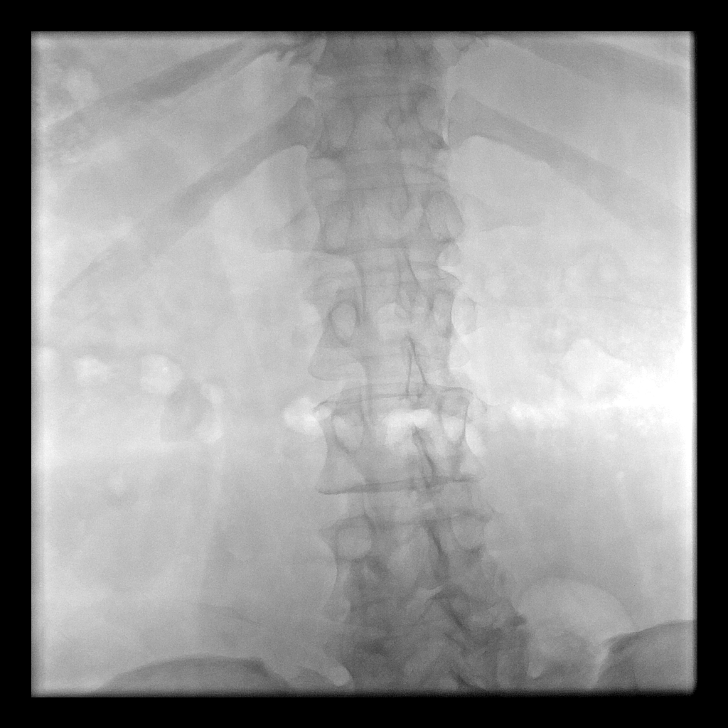

[Series 3: care single · 1 of 1 slices shown (3 of 8)]
[im 1/1]
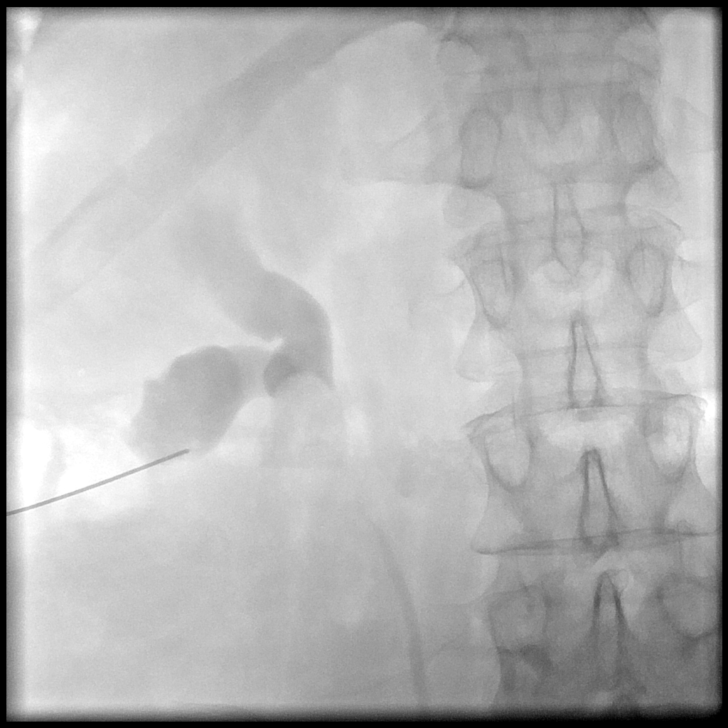

[Series 4: care single · 1 of 1 slices shown (4 of 8)]
[im 1/1]
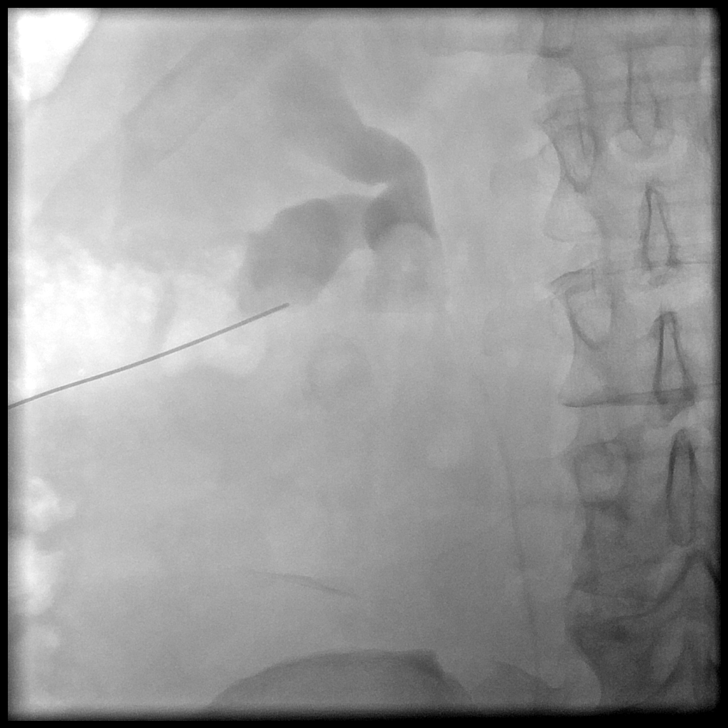

[Series 5: care single · 1 of 1 slices shown (5 of 8)]
[im 1/1]
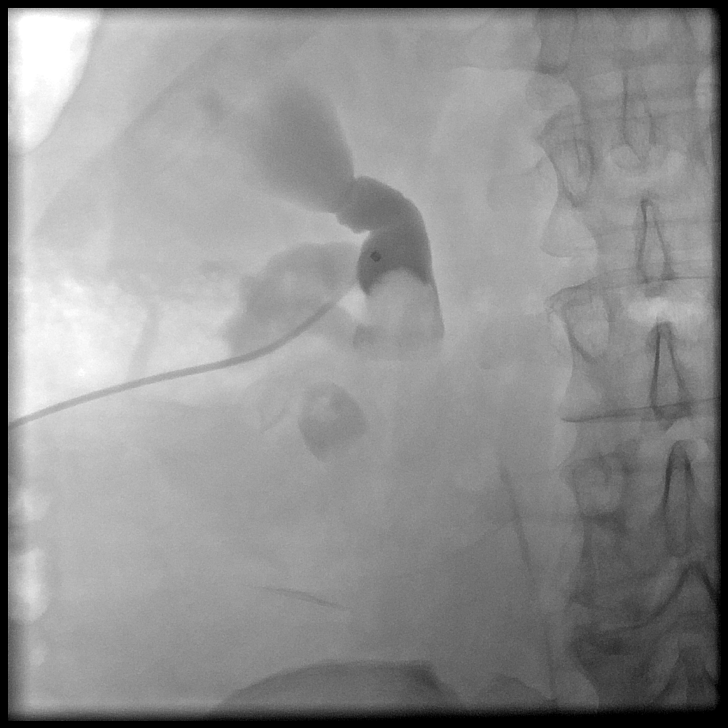

[Series 6: care single · 1 of 1 slices shown (6 of 8)]
[im 1/1]
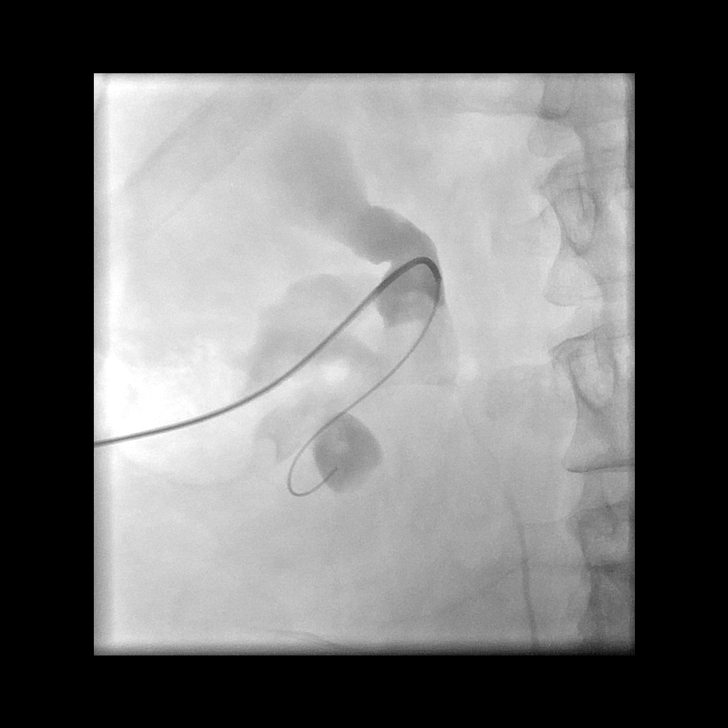

[Series 7: care single · 1 of 1 slices shown (7 of 8)]
[im 1/1]
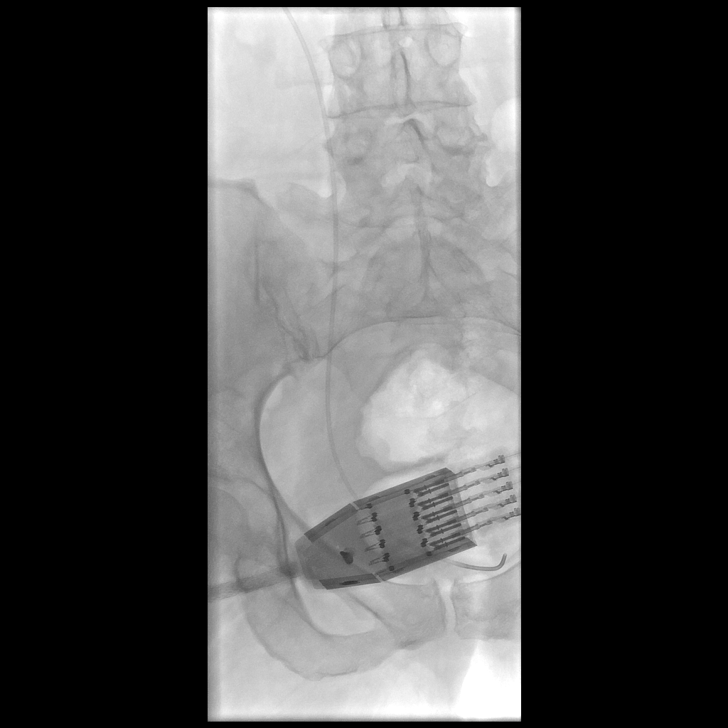

[Series 8: care single · 1 of 1 slices shown (8 of 8)]
[im 1/1]
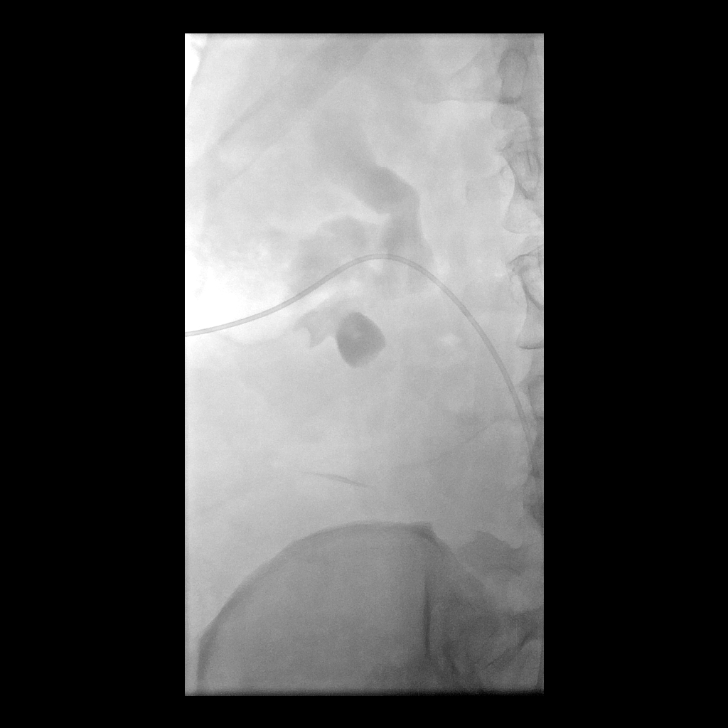

[Series 300: tube placements · 2 of 2 slices shown]
[im 1/2]
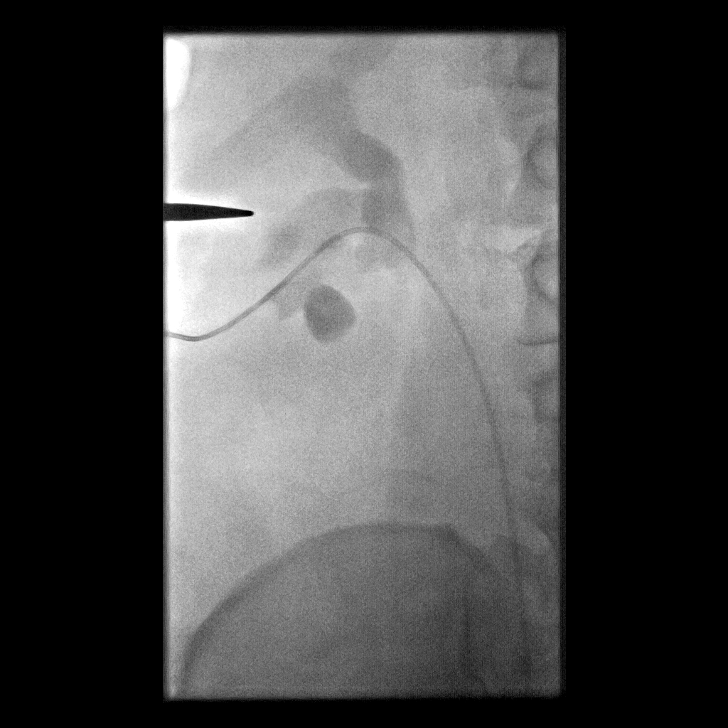
[im 2/2]
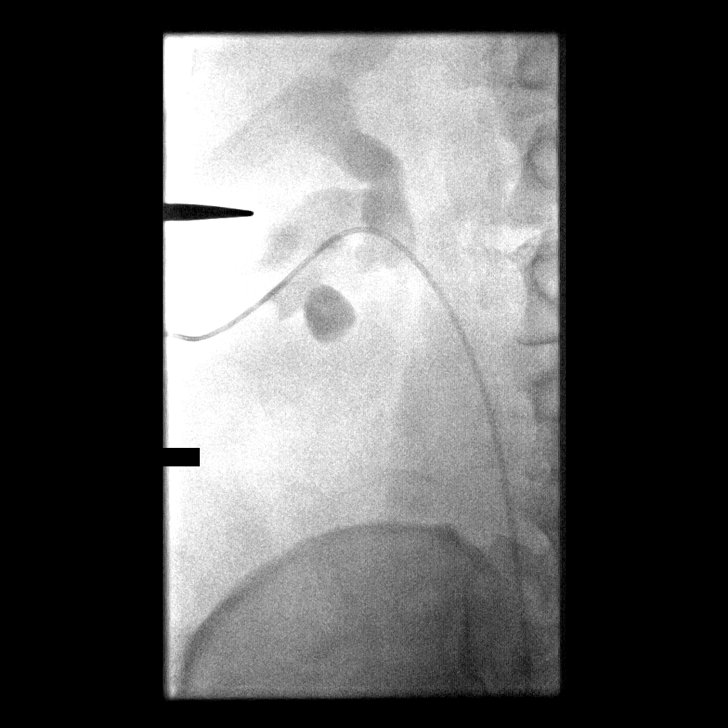

[10 of 10 positions shown; findings below may reference images not displayed]

MEDICATIONS:
Ancef 3 g; The antibiotic was administered in an appropriate time
frame prior to skin puncture.

ANESTHESIA/SEDATION:
Fentanyl 150 mcg IV; Versed 4.0 mg IV

Moderate Sedation Time:  44 minutes

The patient was continuously monitored during the procedure by the
interventional radiology nurse under my direct supervision.

CONTRAST:  Thirty mL-administered into the collecting system(s)

FLUOROSCOPY TIME:  Fluoroscopy Time: 5 minutes 48 seconds (283 mGy).

COMPLICATIONS:
None immediate.

PROCEDURE:
Informed written consent was obtained from the patient after a
thorough discussion of the procedural risks, benefits and
alternatives. All questions were addressed. A timeout was performed
prior to the initiation of the procedure.

Patient was placed prone. Left flank was prepped and draped in
sterile fashion. Maximal barrier sterile technique was utilized
including caps, mask, sterile gowns, sterile gloves, sterile drape,
hand hygiene and skin antiseptic. Skin and soft tissues anesthetized
with 1% lidocaine. 21 gauge needle was directed into a dilated
posterior midpole calyx with ultrasound guidance. Needle placement
was difficult due to body habitus. Needle position was confirmed
within the collecting system and urine was draining from the needle
hub. Contrast injection confirmed placement in the renal collecting
system. 0.018 wire was advanced towards the renal pelvis and
Accustick dilator set was placed. Five French catheter was advanced
beyond the renal pelvic stone with a Roadrunner wire and the
catheter was advanced to the bladder. Catheter was sutured to the
skin and the catheter was capped.
FINDINGS: Moderate left hydronephrosis due to an obstructing renal pelvic
stone. Access was obtained from a posterior mid pole calyx. Probable
small stone within the lower pole calyx. Catheter was advanced to
the bladder.
IMPRESSION: Successful placement of a left nephroureteral catheter with
ultrasound and fluoroscopic guidance.

## 2019-12-10 IMAGING — CR DG ABDOMEN 1V
1 series · 1 of 1 positions shown · non-contrast
Comparison: CT Abdomen 09/12/2018 and earlier.

CLINICAL DATA: 57-year-old female. Preoperative study for left
double-J stent and left lower pole nephrolithiasis.

EXAM:
ABDOMEN - 1 VIEW

[t abdomen supine]
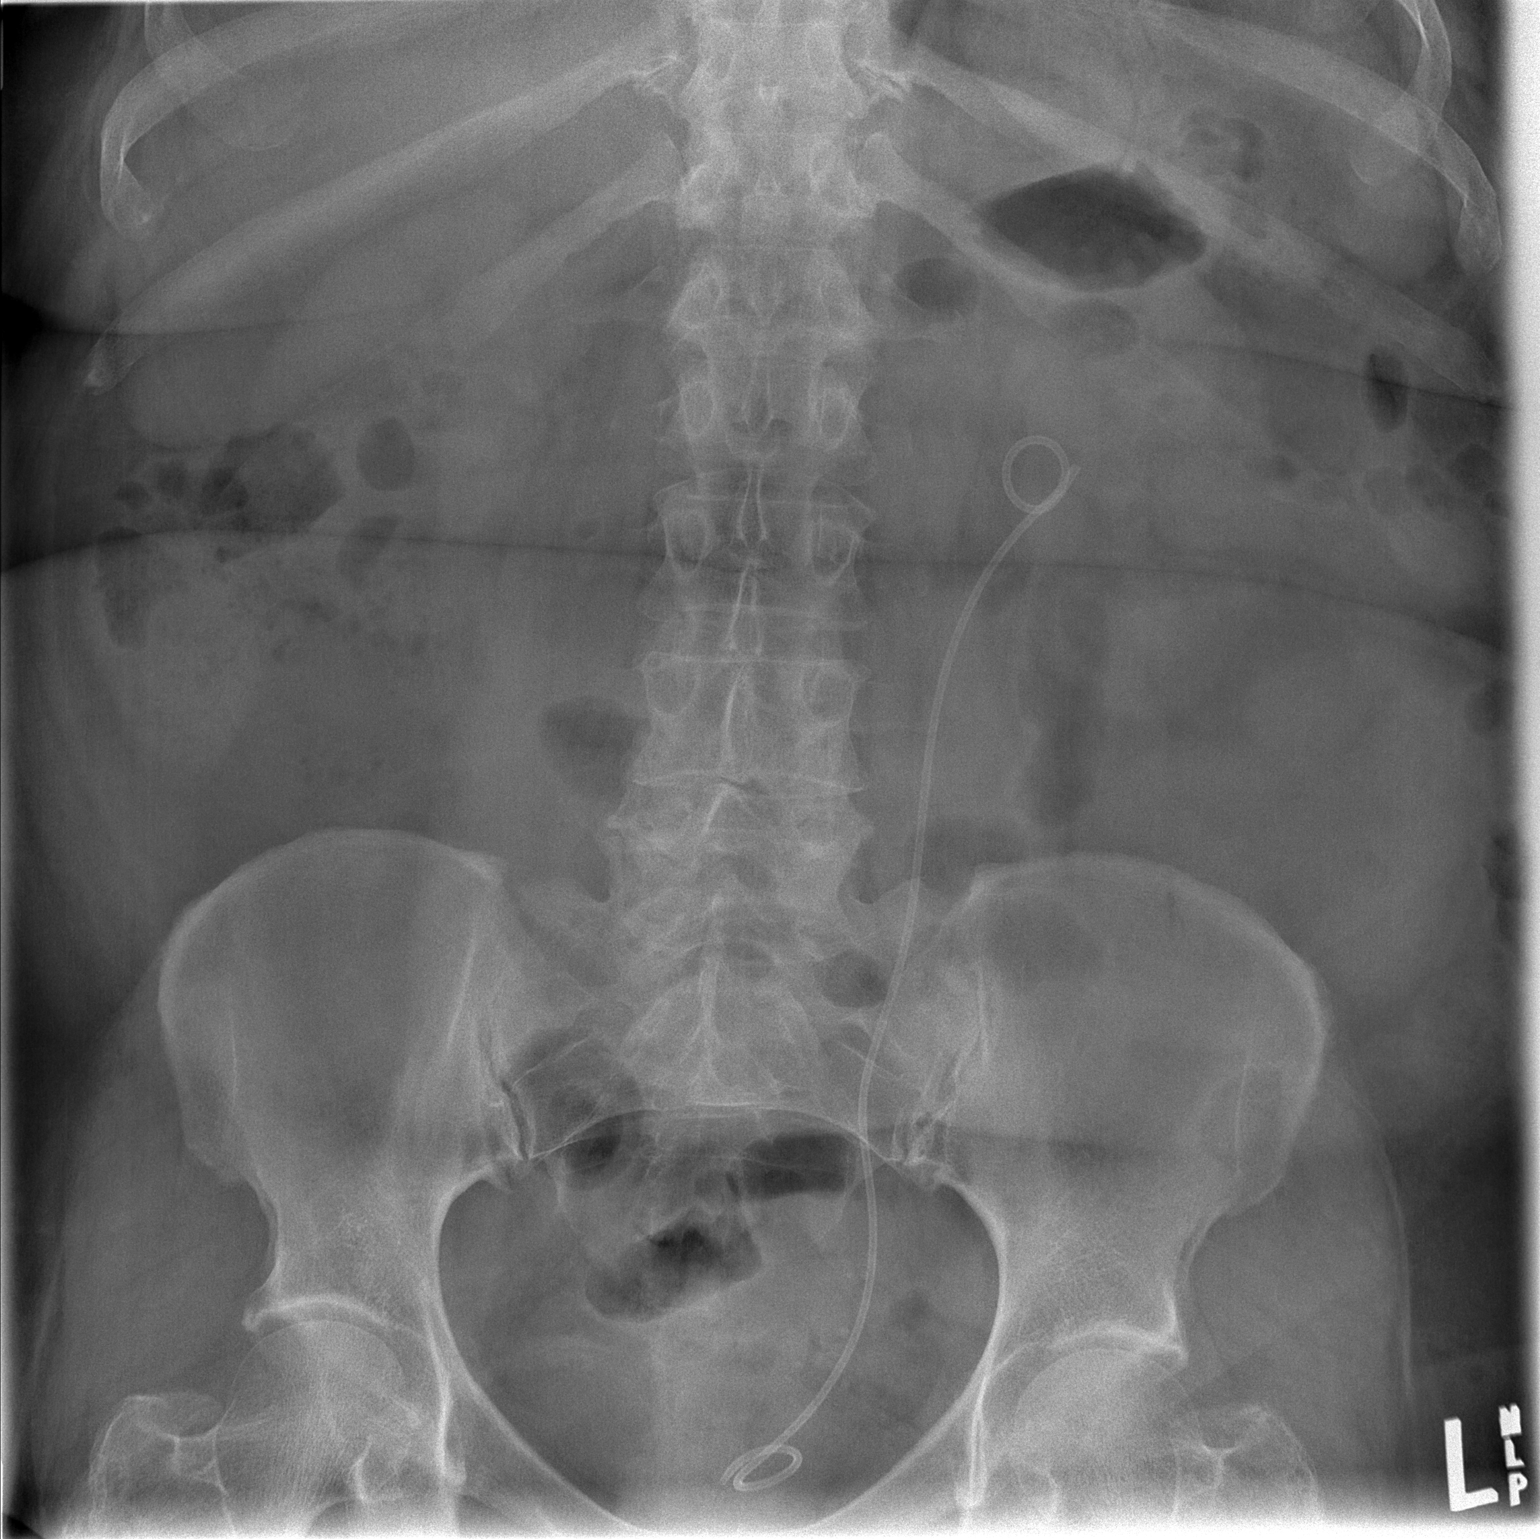

[1 of 1 positions shown; findings below may reference images not displayed]

FINDINGS: Left double-J ureteral stent remains in place and positioning
appears satisfactory. There is vague hyperdensity projecting over
the left renal lower pole. No discrete nephrolithiasis by x-ray.
Pelvic phleboliths again suspected.

Non obstructed bowel gas pattern. No acute osseous abnormality
identified.
IMPRESSION: 1. Left double-J ureteral stent with no adverse features.
2. Vague hyperdensity projecting over the left renal lower pole, but
no discrete nephrolithiasis by x-ray.

## 2023-04-28 ENCOUNTER — Ambulatory Visit (HOSPITAL_COMMUNITY)
Admission: EM | Admit: 2023-04-28 | Discharge: 2023-04-28 | Disposition: A | Discharge status: Home / Self Care | Payer: 59 | Attending: Family Medicine | Admitting: Family Medicine

## 2023-04-28 ENCOUNTER — Encounter (HOSPITAL_COMMUNITY): Payer: Self-pay

## 2023-04-28 DIAGNOSIS — J069 Acute upper respiratory infection, unspecified: Secondary | ICD-10-CM | POA: Diagnosis present

## 2023-04-28 DIAGNOSIS — J029 Acute pharyngitis, unspecified: Secondary | ICD-10-CM | POA: Diagnosis not present

## 2023-04-28 DIAGNOSIS — Z20822 Contact with and (suspected) exposure to covid-19: Secondary | ICD-10-CM | POA: Diagnosis present

## 2023-04-28 LAB — POCT RAPID STREP A (OFFICE): Rapid Strep A Screen: NEGATIVE

## 2023-04-28 NOTE — Discharge Instructions (Addendum)
Your COVID test will result within 24 hours or less.  Our office will call you only if your results are positive.  You can view your results MyChart.  You can continue your current treatment with over-the-counter medications.  Hydrate well with fluids.  Return if symptoms worsen or do not improve within 1 week.

## 2023-04-28 NOTE — ED Triage Notes (Signed)
Patient here today with c/o ST, fever, and fatigue since yesterday. This morning she felt sinus pressure, slight cough. No fever today. She started taking Mucinex last night with some relief. Allegra this morning with some relief. No sick contacts. Her boyfriend felt fatigue on Sunday but that was it. No recent travel.

## 2023-04-28 NOTE — ED Provider Notes (Signed)
[facility]    CSN: [id number] Arrival date & time: 04/28/23  1128      History   Chief Complaint Chief Complaint  Patient presents with   Sore Throat    HPI Rebecca Maldonado is a 62 y.o. female.   HPI Patient presents today with 1 day history of sore throat, fever and fatigue.  Reports no fever today.  She has been taking Mucinex and Allegra as she developed sinus pressure today.  She has not recently traveled or had any known sick contacts. Patient denies SOB, productive cough, or chest tightness.  Past Medical History:  Diagnosis Date   Cellulitis 02/27/2008-2010   Left calf and foot, hospitalized for 5 days   Complication of anesthesia    Coughing, sore throat   Depression    E-coli UTI 03/18/2018   Noted on urine culture   Fatigue    Headache    Leg cramps    Morbid obesity (HCC)    OA (osteoarthritis)    Pneumonia    Psoriasis    Renal calculus, left    Seropositive for herpes simplex 2 infection    Sleep apnea    diagnosis 20 years ago, no longer uses CPAP due to weight loss   UTI (urinary tract infection)     Patient Active Problem List   Diagnosis Date Noted   Renal calculus, left 09/12/2018    Past Surgical History:  Procedure Laterality Date   CESAREAN SECTION     x2   CYSTOSCOPY/URETEROSCOPY/HOLMIUM LASER/STENT PLACEMENT Left 09/30/2018   Procedure: CYSTOSCOPY/STENT REMOVAL/RETROGRADE/URETEROSCOPY/HOLMIUM LASER/BASKET RETRIVAL OF STONE;  Surgeon: Ihor Gully, MD;  Location: [facility];  Service: Urology;  Laterality: Left;   DILATION AND CURETTAGE OF UTERUS  2000   IR URETERAL STENT LEFT NEW ACCESS W/O SEP NEPHROSTOMY CATH  09/12/2018   LIVER BIOPSY     NEPHROLITHOTOMY Left 09/12/2018   Procedure: NEPHROLITHOTOMY PERCUTANEOUS;  Surgeon: Ihor Gully, MD;  Location: [facility];  Service: Urology;  Laterality: Left;   WISDOM TOOTH EXTRACTION      OB History   No obstetric history on file.      Home Medications    Prior to Admission  medications   Medication Sig Start Date End Date Taking? Authorizing Provider  Aspirin-Acetaminophen-Caffeine (EXCEDRIN PO) Take by mouth.    [provider]  clobetasol cream (TEMOVATE) 0.05 % Apply 1 application topically 2 (two) times daily.    [provider]  HYDROcodone-acetaminophen (NORCO) 10-325 MG tablet Take 1-2 tablets by mouth every 4 (four) hours as needed for moderate pain. Maximum dose per 24 hours - 8 pills 09/12/18   Ihor Gully, MD  HYDROcodone-acetaminophen [facility]) 10-325 MG tablet Take 1-2 tablets by mouth every 4 (four) hours as needed for moderate pain. Maximum dose per 24 hours - 8 pills 09/30/18   Ihor Gully, MD  Ixekizumab (TALTZ) 80 MG/ML SOAJ Inject into the skin. 09/14/18   [provider]  naproxen sodium (ALEVE) 220 MG tablet Take 220 mg by mouth.    [provider]  Risankizumab-rzaa (SKYRIZI PEN [place]) Inject into the skin.    [provider]  sulfamethoxazole-trimethoprim (BACTRIM DS,SEPTRA DS) 800-160 MG tablet Take 1 tablet by mouth 2 (two) times daily.     [provider]  traMADol (ULTRAM) 50 MG tablet Take 1 tablet (50 mg total) by mouth every 6 (six) hours as needed. 03/15/18   Ofilia Neas, PA-C    Family History History reviewed. No pertinent family  history.  Social History Social History   Tobacco Use   Smoking status: Never   Smokeless tobacco: Never  Vaping Use   Vaping status: Never Used  Substance Use Topics   Alcohol use: Yes    Comment: Occ   Drug use: Never     Allergies   Wellbutrin [bupropion]   Review of Systems Review of Systems Pertinent negatives listed in HPI   Physical Exam Triage Vital Signs ED Triage Vitals  Encounter Vitals Group     BP 04/28/23 1243 (!) 154/86     Systolic BP Percentile --      Diastolic BP Percentile --      Pulse Rate 04/28/23 1243 76     Resp 04/28/23 1243 16     Temp 04/28/23 1243 98.7 F (37.1 C)     Temp Source 04/28/23 1243  Oral     SpO2 04/28/23 1243 95 %     Weight 04/28/23 1243 295 lb (133.8 kg)     Height 04/28/23 1243 5\' 5"  (1.651 m)     Head Circumference --      Peak Flow --      Pain Score 04/28/23 1242 3     Pain Loc --      Pain Education --      Exclude from Growth Chart --    No data found.  Updated Vital Signs BP (!) 154/86 (BP Location: Right Arm)   Pulse 76   Temp 98.7 F (37.1 C) (Oral)   Resp 16   Ht 5\' 5"  (1.651 m)   Wt 295 lb (133.8 kg)   SpO2 95%   BMI 49.09 kg/m   Visual Acuity Right Eye Distance:   Left Eye Distance:   Bilateral Distance:    Right Eye Near:   Left Eye Near:    Bilateral Near:     Physical Exam Vitals and nursing note reviewed.  Constitutional:      Appearance: She is ill-appearing.  HENT:     Head: Normocephalic and atraumatic.     Right Ear: Tympanic membrane and ear canal normal.     Left Ear: Tympanic membrane and ear canal normal.     Nose: Congestion and rhinorrhea present.     Mouth/Throat:     Mouth: No oral lesions.     Pharynx: Posterior oropharyngeal erythema present. No pharyngeal swelling, oropharyngeal exudate or uvula swelling.     Tonsils: No tonsillar exudate or tonsillar abscesses.  Eyes:     Conjunctiva/sclera: Conjunctivae normal.     Pupils: Pupils are equal, round, and reactive to light.  Cardiovascular:     Rate and Rhythm: Normal rate and regular rhythm.  Pulmonary:     Effort: Pulmonary effort is normal.     Breath sounds: Normal breath sounds.  Musculoskeletal:     Cervical back: Normal range of motion.  Lymphadenopathy:     Cervical: Cervical adenopathy present.  Skin:    General: Skin is warm.  Neurological:     General: No focal deficit present.     Mental Status: She is alert.      UC Treatments / Results  Labs (all labs ordered are listed, but only abnormal results are displayed) Labs Reviewed  SARS CORONAVIRUS 2 (TAT 6-24 HRS) - Abnormal; Notable for the following components:      Result Value    SARS Coronavirus 2 POSITIVE (*)    All other components within normal limits  POCT RAPID STREP A (OFFICE)  EKG   Radiology No results found.  Procedures Procedures (including critical care time)  Medications Ordered in UC Medications - No data to display  Initial Impression / Assessment and Plan / UC Course  I have reviewed the triage vital signs and the nursing notes.  Pertinent labs & imaging results that were available during my care of the patient were reviewed by me and considered in my medical decision making (see chart for details).    Rapid strep negative. COVID test pending., Viral URI, continue symptoms management. Resume normal activities as tolerated. Treatment recommendations per discharge instructions. Final Clinical Impressions(s) / UC Diagnoses   Final diagnoses:  Encounter for laboratory testing for COVID-19 virus  Viral pharyngitis  Viral URI     Discharge Instructions      Your COVID test will result within 24 hours or less.  Our office will call you only if your results are positive.  You can view your results MyChart.  You can continue your current treatment with over-the-counter medications.  Hydrate well with fluids.  Return if symptoms worsen or do not improve within 1 week.     ED Prescriptions   None    PDMP not reviewed this encounter.   Bing Neighbors, NP 05/01/23 [phone]

## 2023-04-29 LAB — SARS CORONAVIRUS 2 (TAT 6-24 HRS): SARS Coronavirus 2: POSITIVE — AB
